# Patient Record
Sex: Male | Born: 1950 | Race: White | Hispanic: No | Marital: Married | Smoking: Never smoker
Health system: Southern US, Community
[De-identification: ages and names within clinical notes are randomized; demographics above are authoritative.]

## PROBLEM LIST (undated history)

## (undated) DIAGNOSIS — I441 Atrioventricular block, second degree: Secondary | ICD-10-CM

## (undated) DIAGNOSIS — R32 Unspecified urinary incontinence: Secondary | ICD-10-CM

## (undated) DIAGNOSIS — I10 Essential (primary) hypertension: Secondary | ICD-10-CM

## (undated) DIAGNOSIS — E1165 Type 2 diabetes mellitus with hyperglycemia: Secondary | ICD-10-CM

## (undated) DIAGNOSIS — E119 Type 2 diabetes mellitus without complications: Secondary | ICD-10-CM

## (undated) DIAGNOSIS — I499 Cardiac arrhythmia, unspecified: Secondary | ICD-10-CM

## (undated) DIAGNOSIS — K639 Disease of intestine, unspecified: Secondary | ICD-10-CM

## (undated) HISTORY — DX: Atrioventricular block, second degree: I44.1

## (undated) HISTORY — DX: Essential (primary) hypertension: I10

## (undated) HISTORY — PX: TUMOR REMOVAL: SHX12

## (undated) HISTORY — DX: Unspecified urinary incontinence: R32

## (undated) HISTORY — DX: Type 2 diabetes mellitus with hyperglycemia: E11.65

## (undated) HISTORY — DX: Disease of intestine, unspecified: K63.9

## (undated) HISTORY — PX: CATARACT EXTRACTION, BILATERAL: SHX1313

## (undated) HISTORY — DX: Cardiac arrhythmia, unspecified: I49.9

---

## 1999-01-17 ENCOUNTER — Emergency Department (HOSPITAL_COMMUNITY): Admission: EM | Admit: 1999-01-17 | Discharge: 1999-01-17 | Payer: Self-pay

## 2001-08-23 ENCOUNTER — Encounter: Payer: Self-pay | Admitting: Emergency Medicine

## 2001-08-23 ENCOUNTER — Emergency Department (HOSPITAL_COMMUNITY): Admission: EM | Admit: 2001-08-23 | Discharge: 2001-08-23 | Payer: Self-pay | Admitting: Emergency Medicine

## 2002-09-04 ENCOUNTER — Emergency Department (HOSPITAL_COMMUNITY): Admission: EM | Admit: 2002-09-04 | Discharge: 2002-09-04 | Payer: Self-pay | Admitting: Emergency Medicine

## 2002-09-04 ENCOUNTER — Encounter: Payer: Self-pay | Admitting: Emergency Medicine

## 2005-06-05 ENCOUNTER — Emergency Department (HOSPITAL_COMMUNITY): Admission: EM | Admit: 2005-06-05 | Discharge: 2005-06-05 | Payer: Self-pay | Admitting: Emergency Medicine

## 2008-03-18 ENCOUNTER — Emergency Department (HOSPITAL_COMMUNITY): Admission: EM | Admit: 2008-03-18 | Discharge: 2008-03-18 | Payer: Self-pay | Admitting: Emergency Medicine

## 2008-03-18 IMAGING — CR DG FOOT COMPLETE 3+V*R*
3 series · 3 of 3 positions shown · non-contrast
Comparison: None available.

CLINICAL DATA: Stepped on nail.  Severe pain.

RIGHT FOOT COMPLETE - 3+ VIEW

[t foot ap right]
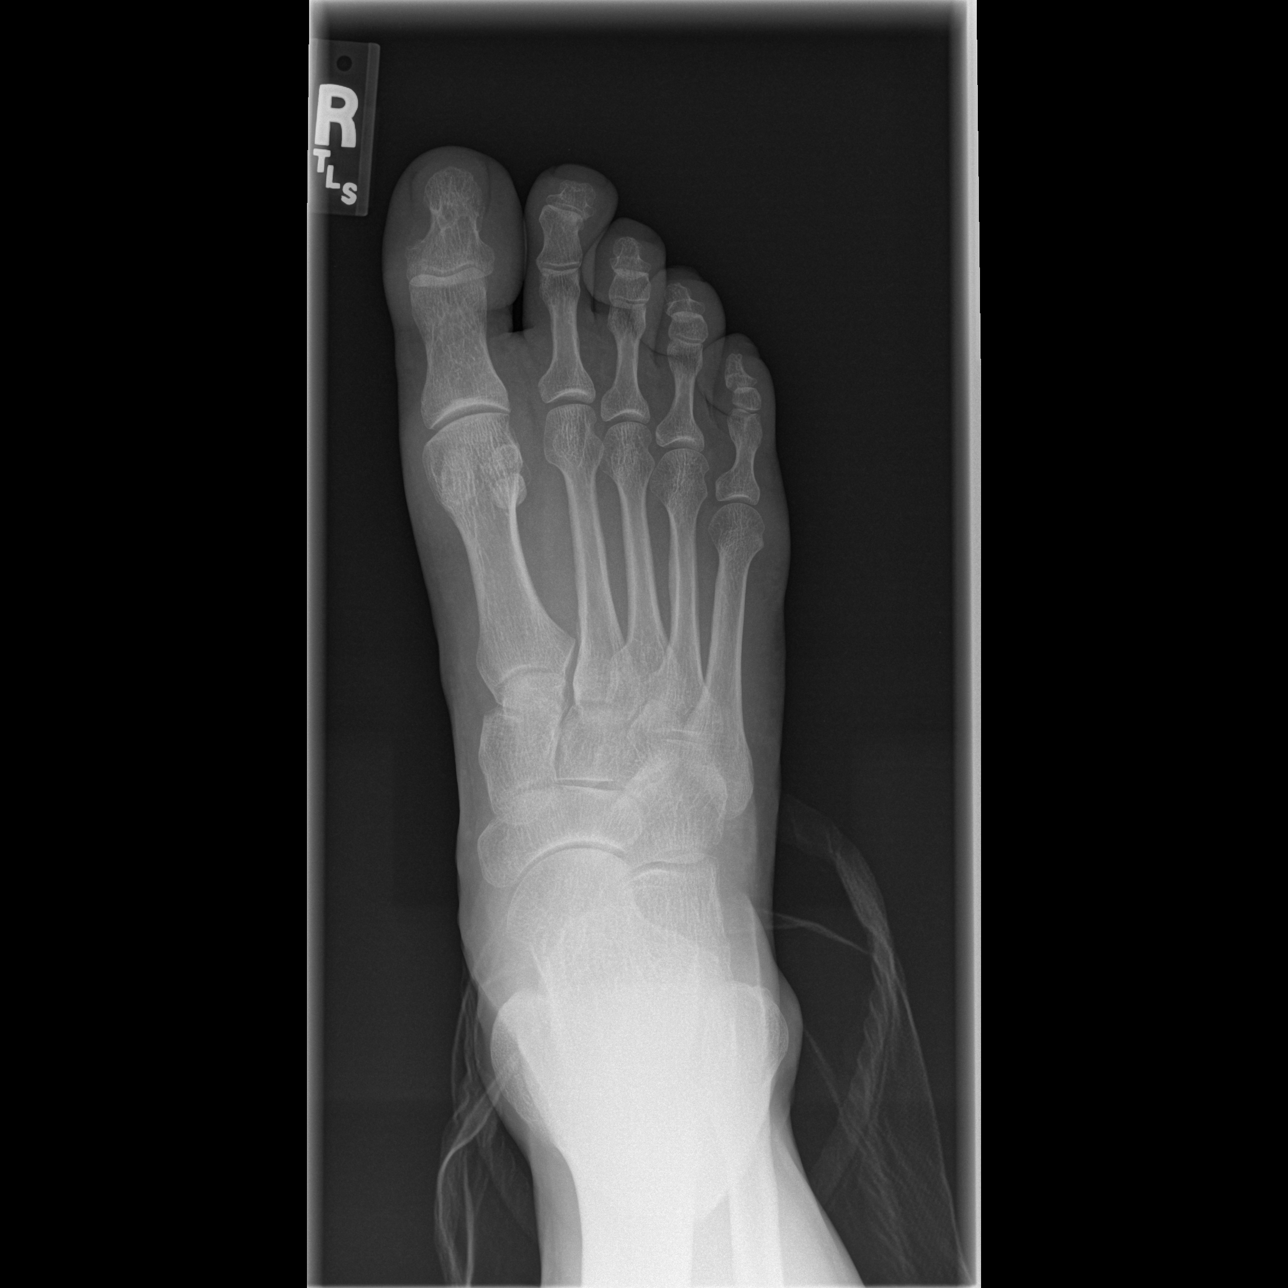

[t foot oblique right]
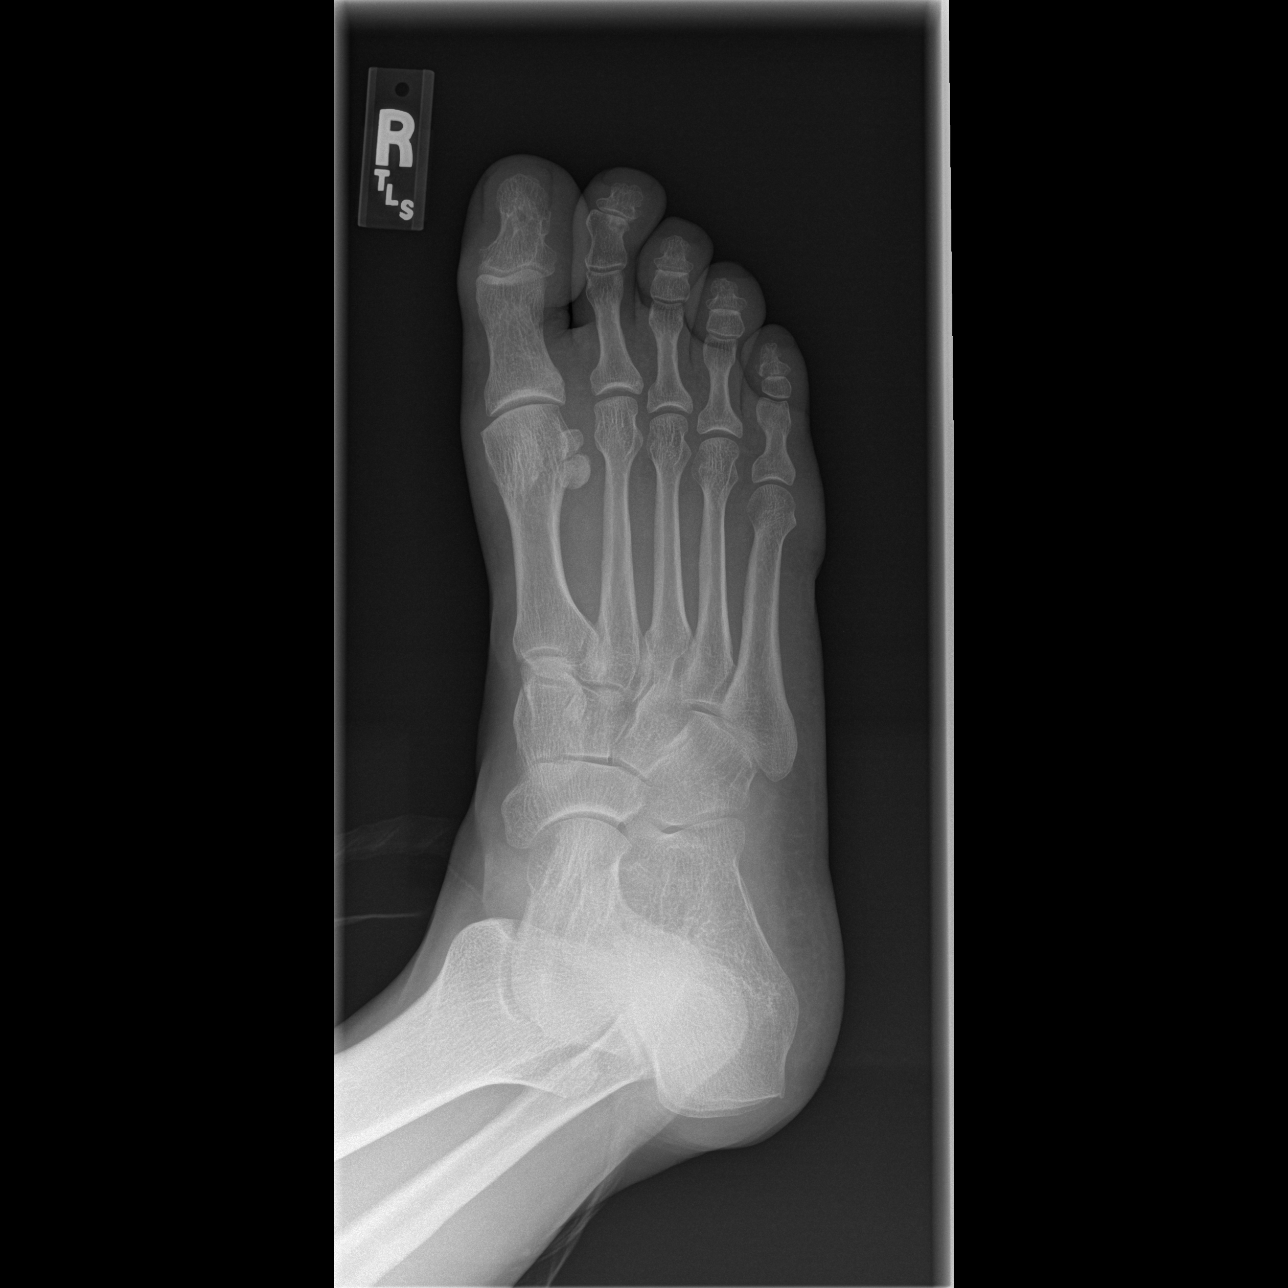

[t foot lat right]
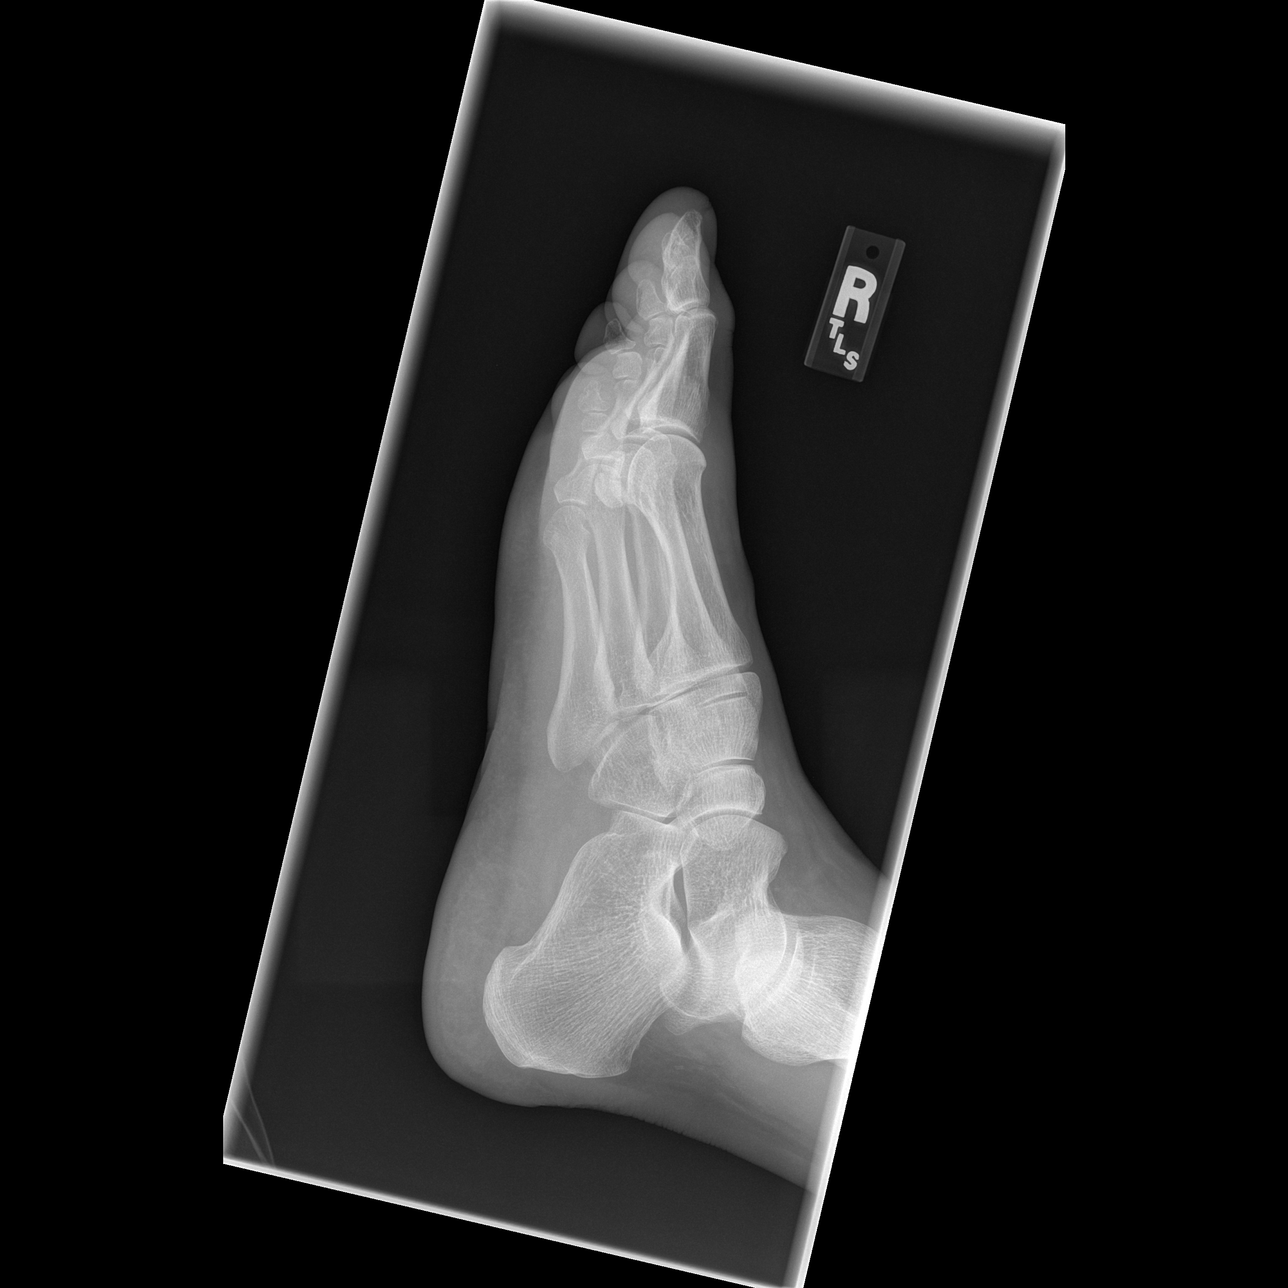

[3 of 3 positions shown; findings below may reference images not displayed]

FINDINGS: There is no evidence for acute fracture.  There is soft
tissue swelling along the plantar are surface the foot at the level
of the metatarsal heads.  There is some soft tissue swelling in the
great toe as well.  No radiopaque foreign body is evident.
IMPRESSION: Soft tissue swelling along the plantar surface of the foot and in
the great toe without evidence for fracture or radiopaque foreign
body.

## 2009-05-22 ENCOUNTER — Emergency Department (HOSPITAL_COMMUNITY): Admission: EM | Admit: 2009-05-22 | Discharge: 2009-05-22 | Payer: Self-pay | Admitting: Emergency Medicine

## 2009-05-22 IMAGING — CT CT ABD-PELV W/ CM
2 of 5 series · 17 of 46 positions shown, 19 images · IV contrast (APPLIED)
Comparison: None

CLINICAL DATA: Right lower quadrant abdominal pain.

CT ABDOMEN AND PELVIS WITH CONTRAST
TECHNIQUE: Multidetector CT imaging of the abdomen and pelvis was
performed following the standard protocol during bolus
administration of intravenous contrast.
Contrast: 125 ml [5Z]

[Series 2: abd_pel 5.0 b40f st · axial · 0.74mm/px · z∈[-436,-12]mm · 14 of 96 slices shown, 16 images]
[im 6/96  soft-tissue]
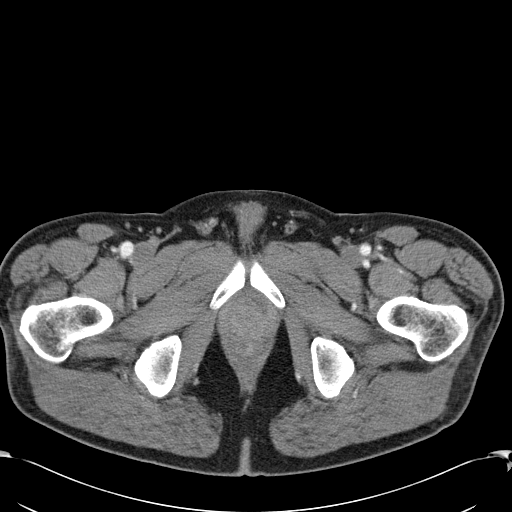
[im 6/96  bone]
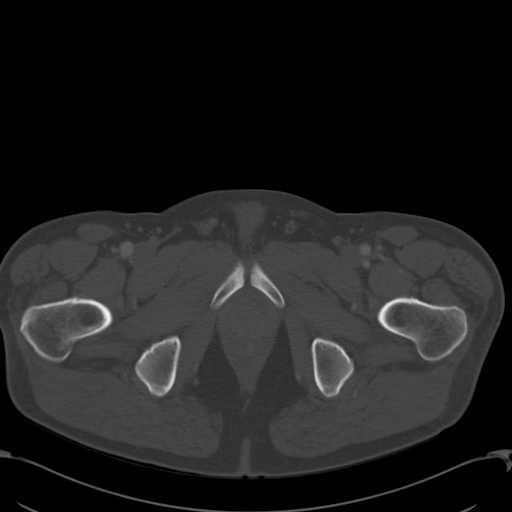
[im 11/96  soft-tissue]
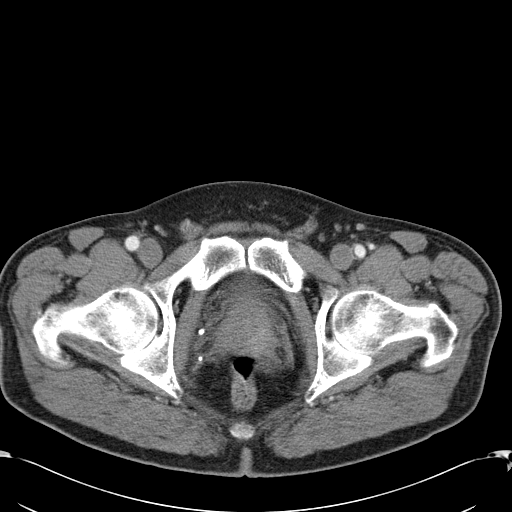
[im 21/96  soft-tissue]
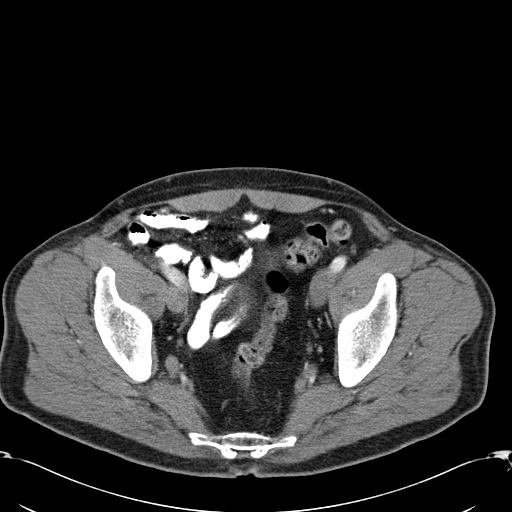
[im 26/96  soft-tissue]
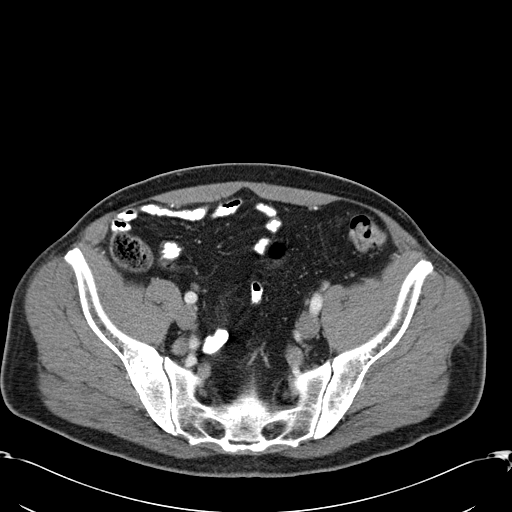
[im 31/96  soft-tissue]
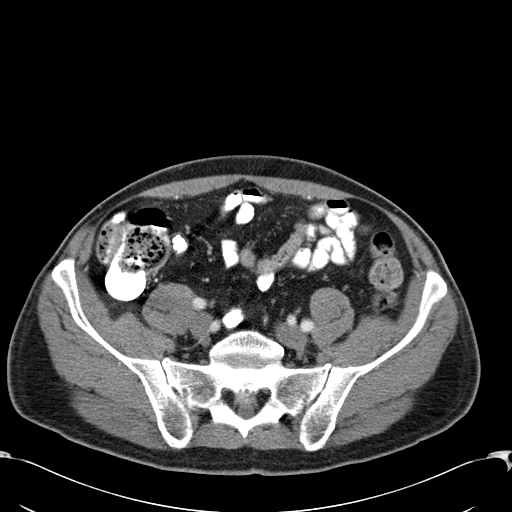
[im 41/96  soft-tissue]
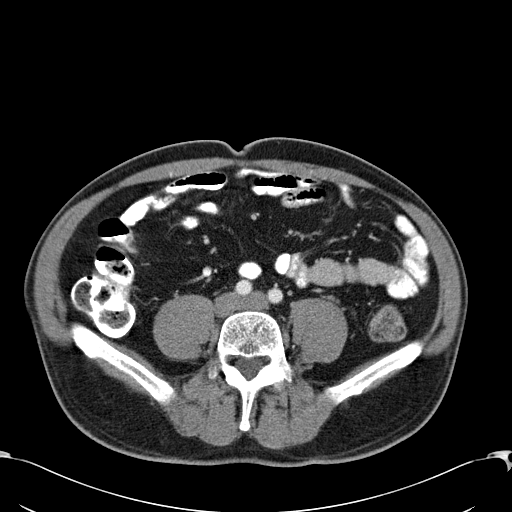
[im 46/96  soft-tissue]
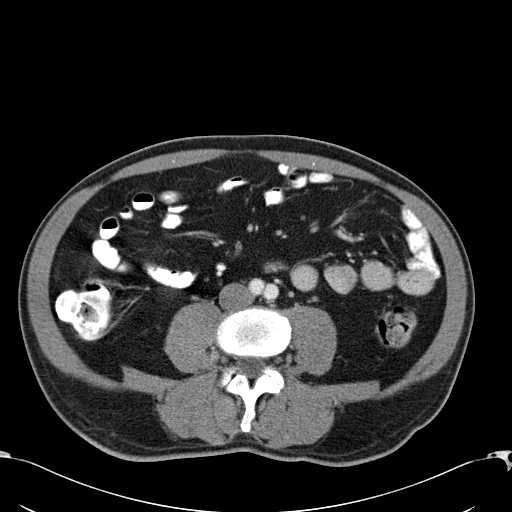
[im 51/96  soft-tissue]
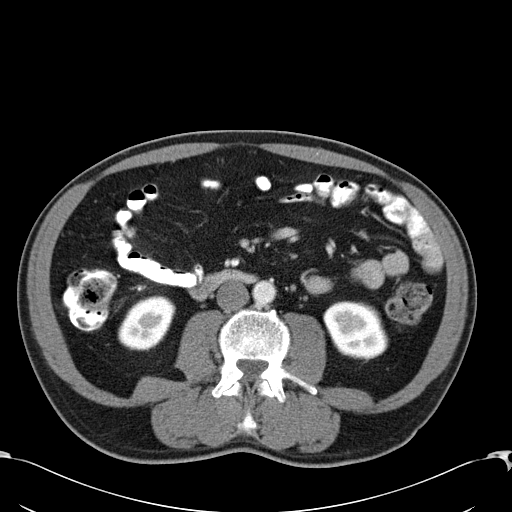
[im 56/96  soft-tissue]
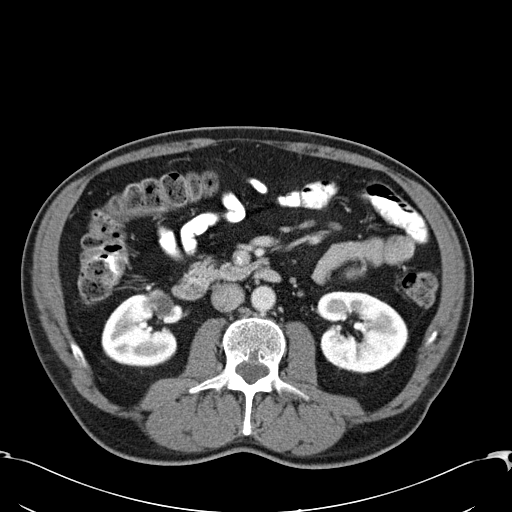
[im 56/96  bone]
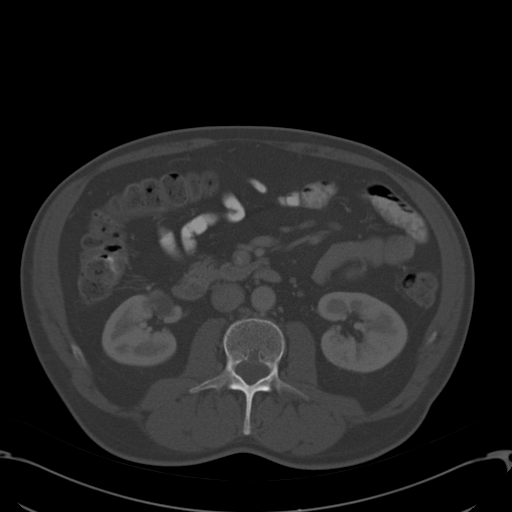
[im 66/96  soft-tissue]
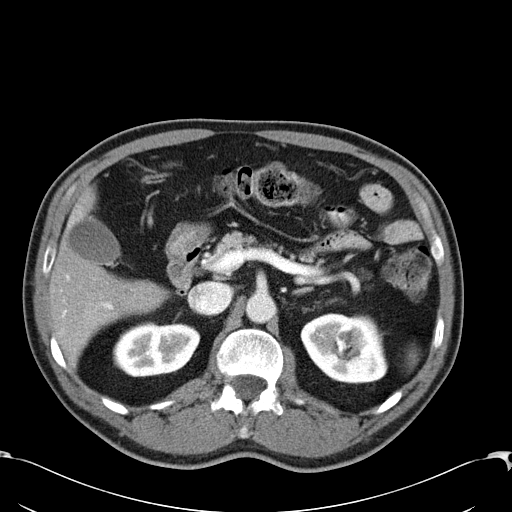
[im 71/96  soft-tissue]
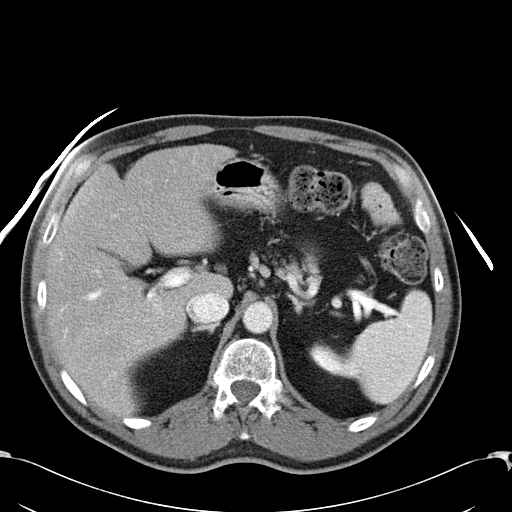
[im 76/96  soft-tissue]
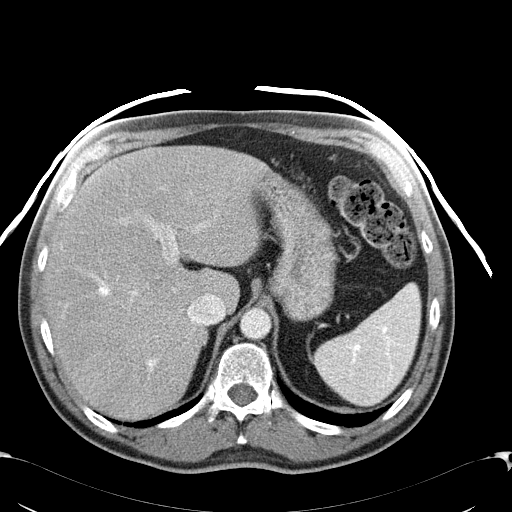
[im 86/96  soft-tissue]
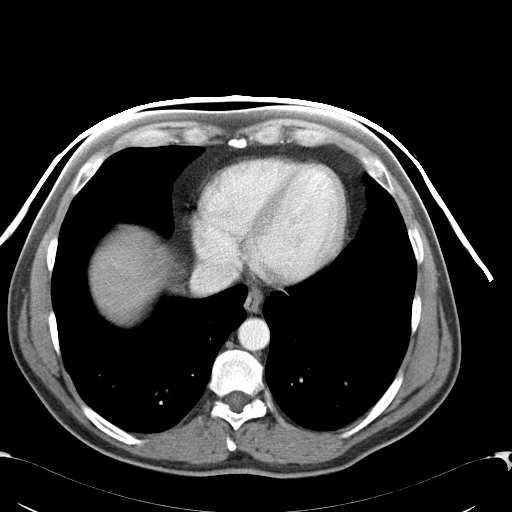
[im 91/96  soft-tissue]
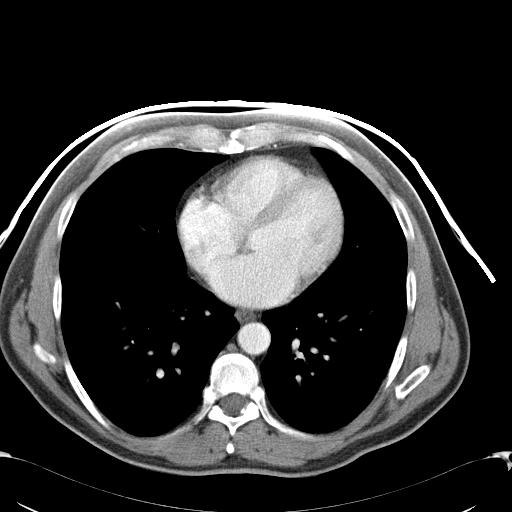

[Series 602: coronal · coronal · 0.97mm/px · 3 of 80 slices shown]
[im 27/80  soft-tissue]
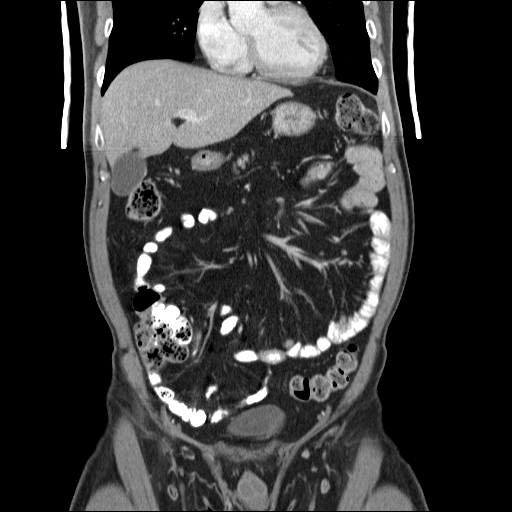
[im 36/80  soft-tissue]
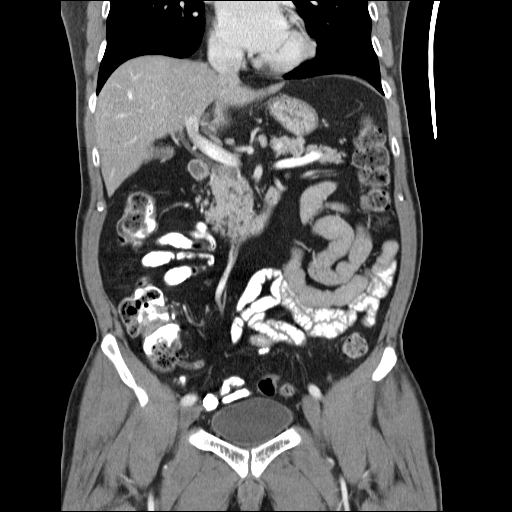
[im 44/80  soft-tissue]
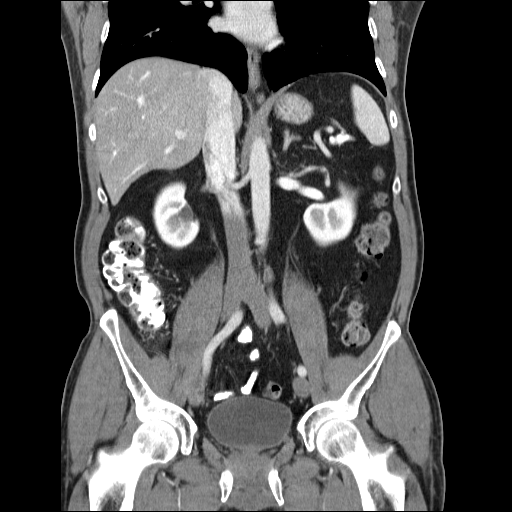

[17 of 46 positions shown; findings below may reference images not displayed]

FINDINGS: The lung bases are clear except for minimal dependent
atelectasis.  A calcified granuloma is noted at the left lung base.

There is mild diffuse fatty infiltration of the liver.  No focal
hepatic lesions or intrahepatic ductal dilatation.  Gallbladder is
normal.  The spleen is normal in size.  The pancreas is
unremarkable.  The adrenal glands and kidneys are normal except for
small bilateral renal cysts.

The stomach, duodenum, small bowel and colon unremarkable.  The
appendix is normal.

The bladder, prostate gland and seminal vesicles are unremarkable.
The rectosigmoid colon are normal. No inguinal mass or hernia.

The bony structures are unremarkable.
IMPRESSION: No acute abdominal/pelvic findings, mass lesions or adenopathy.

## 2010-06-04 LAB — COMPREHENSIVE METABOLIC PANEL
ALT: 24 U/L (ref 0–53)
AST: 17 U/L (ref 0–37)
Albumin: 4 g/dL (ref 3.5–5.2)
Alkaline Phosphatase: 76 U/L (ref 39–117)
Chloride: 104 mEq/L (ref 96–112)
GFR calc Af Amer: >60 mL/min (ref >60)
Potassium: 4 mEq/L (ref 3.5–5.1)
Sodium: 137 mEq/L (ref 135–145)
Total Bilirubin: 2 mg/dL — ABNORMAL HIGH (ref 0.3–1.2)

## 2010-06-04 LAB — DIFFERENTIAL
Basophils Absolute: 0 10*3/uL (ref 0.0–0.1)
Eosinophils Relative: 2 % (ref 0–5)
Lymphocytes Relative: 25 % (ref 12–46)
Monocytes Absolute: 0.7 10*3/uL (ref 0.1–1.0)

## 2010-06-04 LAB — URINALYSIS, ROUTINE W REFLEX MICROSCOPIC
Bilirubin Urine: NEGATIVE
Glucose, UA: >1000 mg/dL — AB
Hgb urine dipstick: NEGATIVE
Ketones, ur: NEGATIVE mg/dL
Leukocytes, UA: NEGATIVE
Nitrite: NEGATIVE
Protein, ur: NEGATIVE mg/dL
Specific Gravity, Urine: 1.02 (ref 1.005–1.030)
Urobilinogen, UA: 0.2 mg/dL (ref 0.0–1.0)
pH: 5 (ref 5.0–8.0)

## 2010-06-04 LAB — URINE MICROSCOPIC-ADD ON

## 2010-06-04 LAB — CBC
Platelets: 197 10*3/uL (ref 150–400)
WBC: 7.2 10*3/uL (ref 4.0–10.5)

## 2012-06-17 ENCOUNTER — Encounter (HOSPITAL_COMMUNITY): Payer: Self-pay | Admitting: Emergency Medicine

## 2012-06-17 ENCOUNTER — Emergency Department (HOSPITAL_COMMUNITY)
Admission: EM | Admit: 2012-06-17 | Discharge: 2012-06-17 | Disposition: A | Discharge status: Home / Self Care | Attending: Emergency Medicine | Admitting: Emergency Medicine

## 2012-06-17 DIAGNOSIS — IMO0002 Reserved for concepts with insufficient information to code with codable children: Secondary | ICD-10-CM | POA: Insufficient documentation

## 2012-06-17 DIAGNOSIS — M5416 Radiculopathy, lumbar region: Secondary | ICD-10-CM

## 2012-06-17 DIAGNOSIS — R52 Pain, unspecified: Secondary | ICD-10-CM | POA: Insufficient documentation

## 2012-06-17 MED ORDER — OXYCODONE-ACETAMINOPHEN 5-325 MG PO TABS: 2.0000 | ORAL_TABLET | ORAL | Status: DC | PRN

## 2012-06-17 MED ORDER — OXYCODONE-ACETAMINOPHEN 5-325 MG PO TABS
1.0000 | ORAL_TABLET | Freq: Once | ORAL | Status: AC
  Administered 2012-06-17: 1 via ORAL
  Filled 2012-06-17: qty 1

## 2012-06-17 MED ORDER — PREDNISONE 20 MG PO TABS: ORAL_TABLET | ORAL | Status: DC

## 2012-06-17 MED ORDER — FENTANYL CITRATE 0.05 MG/ML IJ SOLN
100.0000 ug | Freq: Once | INTRAMUSCULAR | Status: AC
  Administered 2012-06-17: 100 ug via NASAL
  Filled 2012-06-17: qty 2

## 2012-06-17 MED ORDER — PREDNISONE 20 MG PO TABS
60.0000 mg | ORAL_TABLET | Freq: Once | ORAL | Status: AC
  Administered 2012-06-17: 60 mg via ORAL
  Filled 2012-06-17: qty 3

## 2012-06-17 NOTE — ED Notes (Signed)
Right leg pain on outer side from thigh to knee, no known injury

## 2012-06-17 NOTE — ED Provider Notes (Signed)
History     CSN: [id number]  Arrival date & time 06/17/12  0135   First MD Initiated Contact with Patient 06/17/12 0448      Chief Complaint  Patient presents with  . Leg Pain    (Consider location/radiation/quality/duration/timing/severity/associated sxs/prior treatment) HPI This 62 year old male has a several day history of a constant 24-hour a day gradual onset severe right leg pain radiating from his right posterior low back and hip area down his right thigh below his knee to his lower leg worse with certain position changes but better if he actually exerts himself for a while without associated weakness or numbness swelling or color change to his leg without change in bowel or bladder function without fever without IV drug abuse he also is no abdominal pain chest pain or shortness of breath, he has had no improvement with over-the-counter anti-inflammatory and did have some improvement in the emergency department with one Percocet tablet, he has some chronic baseline low back pain but typically does not have radicular type pain, he is no color change or swelling to his leg and no trauma or recent immobilization. History reviewed. No pertinent past medical history.  Past Surgical History  Procedure Laterality Date  . 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 No pertinent family history.  History  Substance Use Topics  . Smoking status: Never Smoker   . Smokeless tobacco: Never Used  . Alcohol Use: Yes      Review of Systems 10 Systems reviewed and are negative for acute change except as noted in the HPI. Allergies  Vicodin  Home Medications   Current Outpatient Rx  Name  Route  Sig  Dispense  Refill  . naproxen (NAPROSYN) 500 MG tablet   Oral   Take 500 mg by mouth 2 (two) times daily with a meal.         . oxyCODONE-acetaminophen (PERCOCET) 5-325 MG per tablet   Oral   Take 2 tablets by mouth every 4 (four) hours as needed  for pain.   20 tablet   0   . predniSONE (DELTASONE) 20 MG tablet      2 tabs po daily x 4 days   8 tablet   0     BP 136/77  Pulse 67  Temp(Src) 97.7 F (36.5 C) (Oral)  Resp 18  SpO2 100%  Physical Exam  Nursing note and vitals reviewed. Constitutional:  Awake, alert, nontoxic appearance with baseline speech.  HENT:  Head: Atraumatic.  Eyes: Pupils are equal, round, and reactive to light. Right eye exhibits no discharge. Left eye exhibits no discharge.  Neck: Neck supple.  Cardiovascular: Normal rate and regular rhythm.   No murmur heard. Pulmonary/Chest: Effort normal and breath sounds normal. No respiratory distress. He has no wheezes. He has no rales. He exhibits no tenderness.  Abdominal: Soft. Bowel sounds are normal. He exhibits no mass. There is no tenderness. There is no rebound.  Musculoskeletal: He exhibits no edema and no tenderness.       Thoracic back: He exhibits no tenderness.       Lumbar back: He exhibits no tenderness.  Bilateral lower extremities non tender without new rashes or color change, baseline ROM with intact DP pulses, CR<2 secs all digits bilaterally, sensation baseline light touch bilaterally for pt, DTR's symmetric and intact bilaterally KJ / AJ, motor symmetric bilateral 5 / 5 hip flexion, quadriceps, hamstrings, EHL, foot dorsiflexion, foot  plantarflexion, gait somewhat antalgic but without apparent new ataxia. Back nontender.  Neurological: He is alert.  Mental status baseline for patient.  Upper extremity motor strength and sensation intact and symmetric bilaterally.  Skin: No rash noted.  Psychiatric: He has a normal mood and affect.    ED Course  Procedures (including critical care time)  Labs Reviewed - No data to display No results found.   1. Lumbar radiculopathy, acute       MDM  I doubt any other EMC precluding discharge at this time including, but not necessarily limited to the following:SBI, AAA, cauda equina  syndrome.        Hurman Horn, MD 06/23/12 [phone]

## 2012-11-17 ENCOUNTER — Other Ambulatory Visit (HOSPITAL_COMMUNITY): Payer: Self-pay | Admitting: Family Medicine

## 2012-11-17 DIAGNOSIS — R109 Unspecified abdominal pain: Secondary | ICD-10-CM

## 2012-11-17 DIAGNOSIS — R140 Abdominal distension (gaseous): Secondary | ICD-10-CM

## 2012-11-24 ENCOUNTER — Encounter (HOSPITAL_COMMUNITY)
Admission: RE | Admit: 2012-11-24 | Discharge: 2012-11-24 | Disposition: A | Discharge status: Home / Self Care | Source: Ambulatory Visit | Attending: Family Medicine | Admitting: Family Medicine

## 2012-11-24 DIAGNOSIS — R141 Gas pain: Secondary | ICD-10-CM | POA: Insufficient documentation

## 2012-11-24 DIAGNOSIS — R109 Unspecified abdominal pain: Secondary | ICD-10-CM

## 2012-11-24 DIAGNOSIS — R140 Abdominal distension (gaseous): Secondary | ICD-10-CM

## 2012-11-24 DIAGNOSIS — R142 Eructation: Secondary | ICD-10-CM | POA: Insufficient documentation

## 2012-11-24 IMAGING — NM NM HEPATO W/GB/PHARM/[PERSON_NAME]
1 series · 12 of 12 positions shown · non-contrast
Comparison: none

CLINICAL DATA: Right-sided abdominal pain.

[Series 1: hepato · 4.46mm/px · 2 acquisitions, 12 frames shown]
[im 1/2]
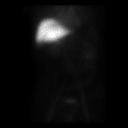
[im 1/2]
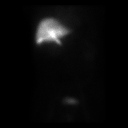
[im 1/2]
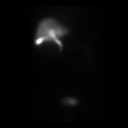
[im 1/2]
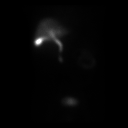
[im 1/2]
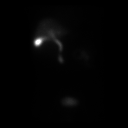
[im 1/2]
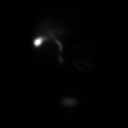
[im 2/2]
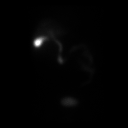
[im 2/2]
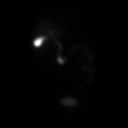
[im 2/2]
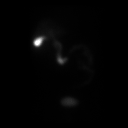
[im 2/2]
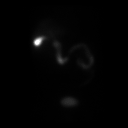
[im 2/2]
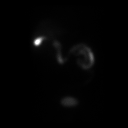
[im 2/2]
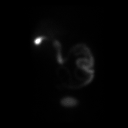

[12 of 12 positions shown; findings below may reference images not displayed]

NUCLEAR MEDICINE HEPATOBILIARY IMAGING WITH GALLBLADDER EF

Technique and findings:  Anterior imaging [HT] [HT]
Choletec IV. There is prompt clearance of the radiopharmaceutical
from the blood pool. Timely visualization of activity in central
bile ducts, small bowel, and gallbladder.
After 1 hour,1.4 mcg CCK was infused intravenously and imaging
continued. No symptoms with infusion. The calculated gallbladder
minutes (Ziessman et al., [HT] J. Nuclear Med).]

IMPRESSION
1. Patency of cystic and common bile ducts.
2. Normal gallbladder ejection fraction.

## 2012-11-24 MED ORDER — SINCALIDE 5 MCG IJ SOLR: 0.0200 ug/kg | Freq: Once | INTRAMUSCULAR | Status: DC

## 2012-11-24 MED ORDER — TECHNETIUM TC 99M MEBROFENIN IV KIT
5.4000 | PACK | Freq: Once | INTRAVENOUS | Status: AC | PRN
Start: 2012-11-24 — End: 2012-11-24
  Administered 2012-11-24: 5 via INTRAVENOUS

## 2019-03-10 ENCOUNTER — Emergency Department (HOSPITAL_COMMUNITY)
Admission: EM | Admit: 2019-03-10 | Discharge: 2019-03-11 | Disposition: A | Discharge status: Home / Self Care | Payer: Medicare Other | Attending: Emergency Medicine | Admitting: Emergency Medicine

## 2019-03-10 ENCOUNTER — Encounter (HOSPITAL_COMMUNITY): Payer: Self-pay

## 2019-03-10 ENCOUNTER — Emergency Department (HOSPITAL_COMMUNITY): Payer: Medicare Other

## 2019-03-10 ENCOUNTER — Other Ambulatory Visit: Payer: Self-pay

## 2019-03-10 DIAGNOSIS — R319 Hematuria, unspecified: Secondary | ICD-10-CM | POA: Insufficient documentation

## 2019-03-10 DIAGNOSIS — E119 Type 2 diabetes mellitus without complications: Secondary | ICD-10-CM | POA: Diagnosis not present

## 2019-03-10 HISTORY — DX: Type 2 diabetes mellitus without complications: E11.9

## 2019-03-10 LAB — BASIC METABOLIC PANEL
Anion gap: 14 (ref 5–15)
BUN: 14 mg/dL (ref 8–23)
CO2: 21 mmol/L — ABNORMAL LOW (ref 22–32)
Calcium: 9.4 mg/dL (ref 8.9–10.3)
Chloride: 99 mmol/L (ref 98–111)
Creatinine, Ser: 0.84 mg/dL (ref 0.61–1.24)
GFR calc Af Amer: >60 mL/min (ref >60)
GFR calc non Af Amer: >60 mL/min (ref >60)
Glucose, Bld: 233 mg/dL — ABNORMAL HIGH (ref 70–99)
Potassium: 3.9 mmol/L (ref 3.5–5.1)
Sodium: 134 mmol/L — ABNORMAL LOW (ref 135–145)

## 2019-03-10 LAB — CBC
HCT: 49.5 % (ref 39.0–52.0)
Hemoglobin: 16.8 g/dL (ref 13.0–17.0)
MCH: 29.4 pg (ref 26.0–34.0)
MCHC: 33.9 g/dL (ref 30.0–36.0)
MCV: 86.7 fL (ref 80.0–100.0)
Platelets: 220 10*3/uL (ref 150–400)
RBC: 5.71 MIL/uL (ref 4.22–5.81)
RDW: 12.6 % (ref 11.5–15.5)
WBC: 7.7 10*3/uL (ref 4.0–10.5)
nRBC: 0 % (ref 0.0–0.2)

## 2019-03-10 IMAGING — CT CT RENAL STONE PROTOCOL
2 of 4 series · 16 of 46 positions shown, 18 images · non-contrast
Comparison: CT [DATE]

CLINICAL DATA: Left flank pain with hematuria

EXAM:
CT ABDOMEN AND PELVIS WITHOUT CONTRAST
TECHNIQUE: Multidetector CT imaging of the abdomen and pelvis was performed
following the standard protocol without IV contrast.

[Series 2: axial st · axial · 0.68mm/px · z∈[+1114,+1529]mm · 13 of 93 slices shown, 15 images]
[im 5/93  soft-tissue]
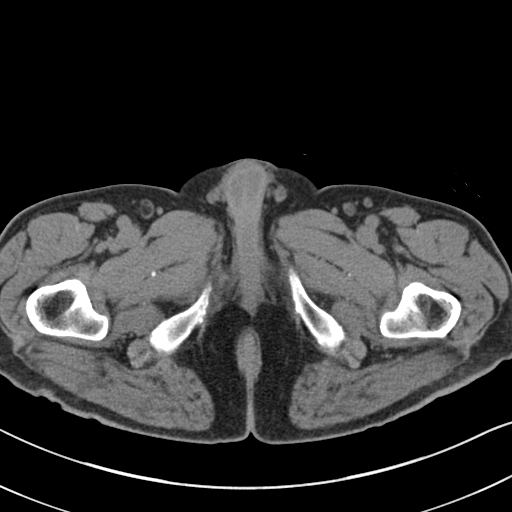
[im 5/93  bone]
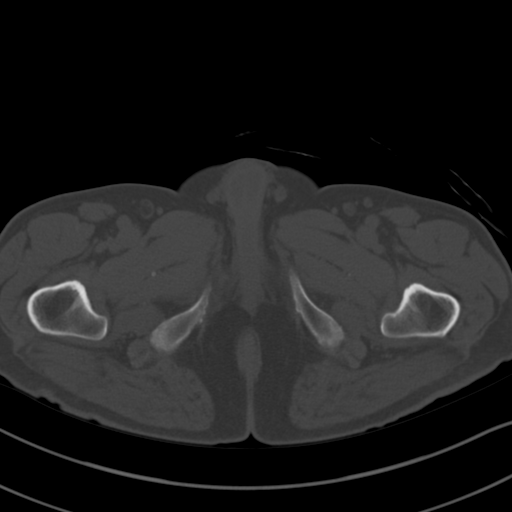
[im 15/93  soft-tissue]
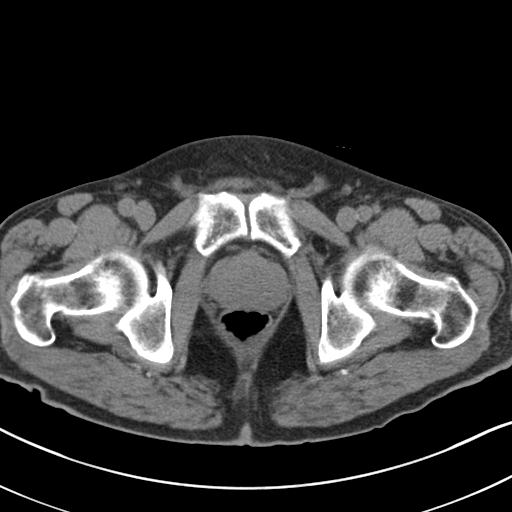
[im 20/93  soft-tissue]
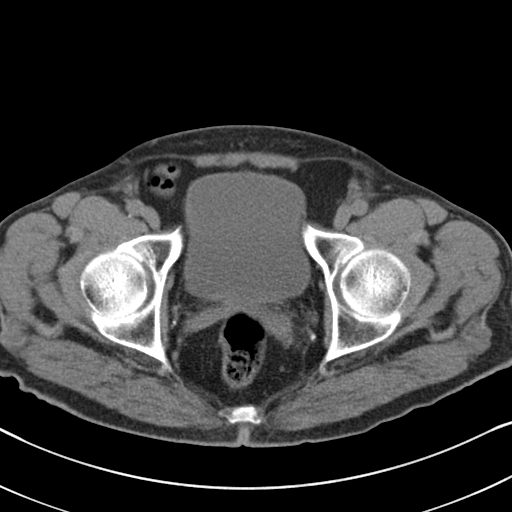
[im 25/93  soft-tissue]
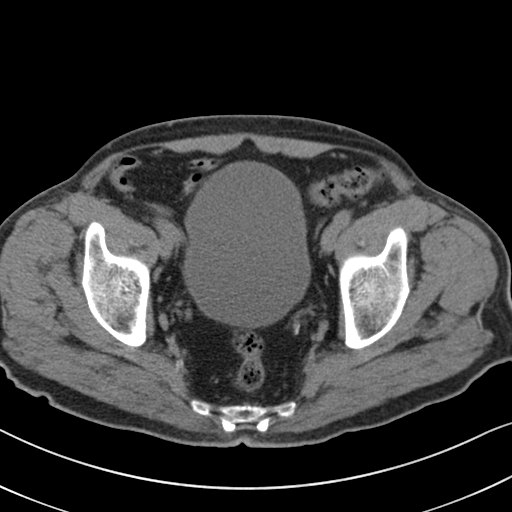
[im 34/93  soft-tissue]
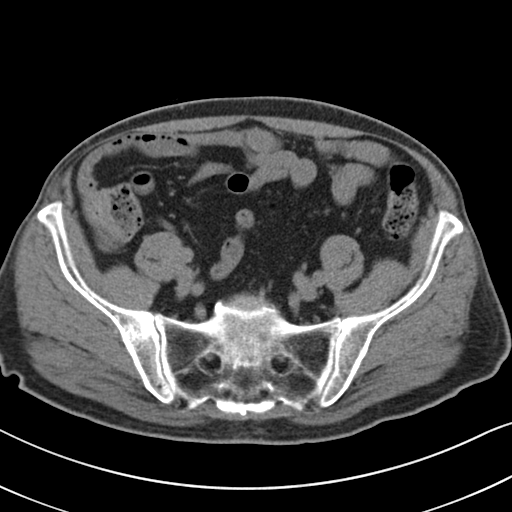
[im 39/93  soft-tissue]
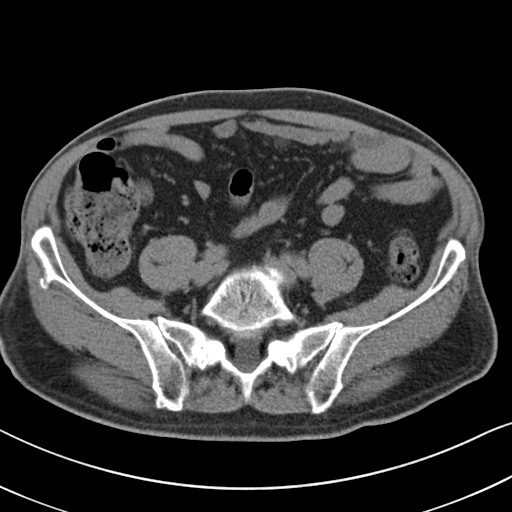
[im 49/93  soft-tissue]
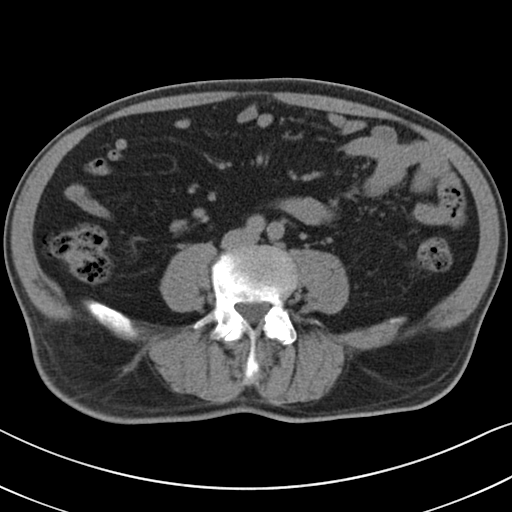
[im 54/93  soft-tissue]
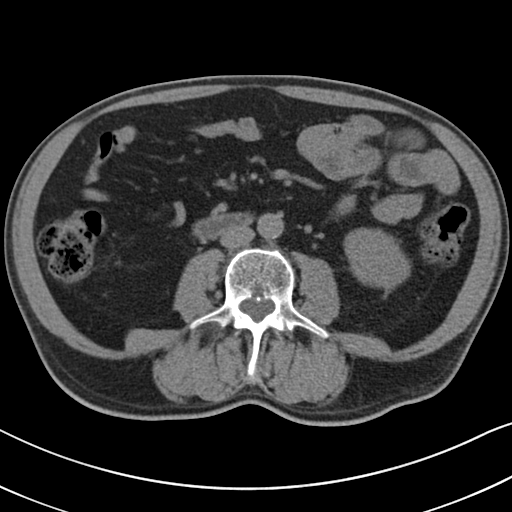
[im 59/93  soft-tissue]
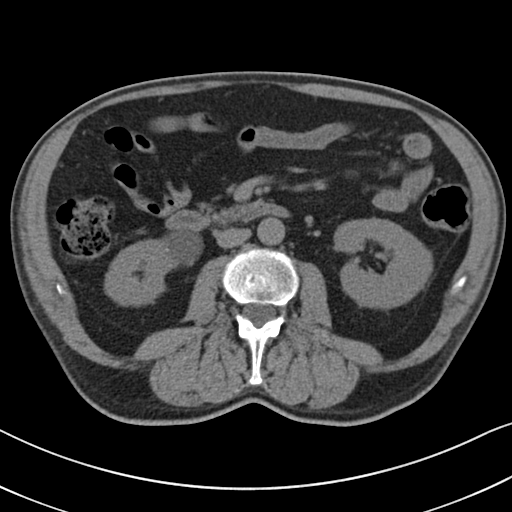
[im 59/93  bone]
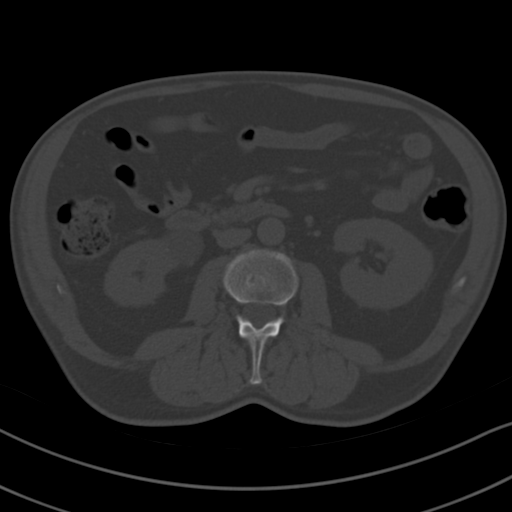
[im 68/93  soft-tissue]
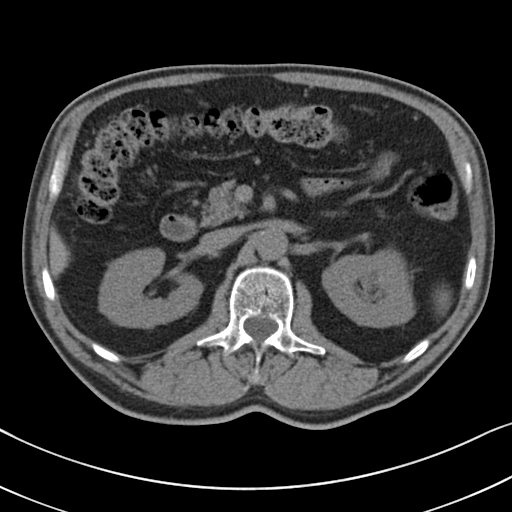
[im 73/93  soft-tissue]
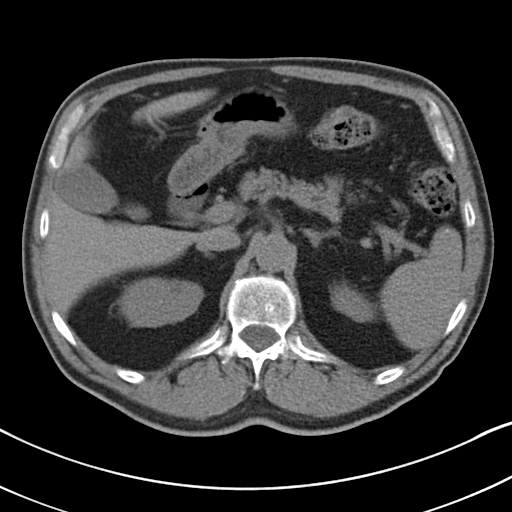
[im 78/93  soft-tissue]
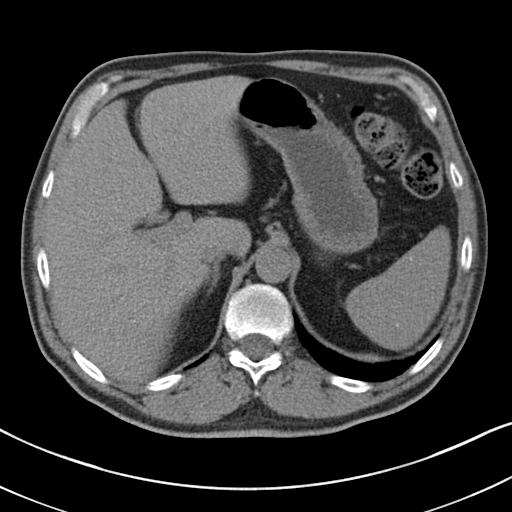
[im 88/93  soft-tissue]
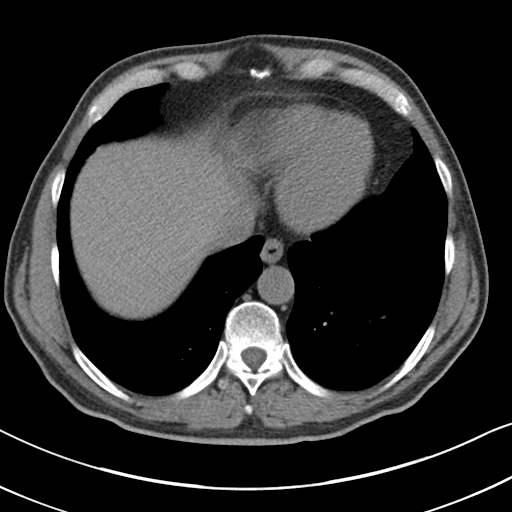

[Series 5: coronal · coronal · 0.65mm/px · 3 of 126 slices shown]
[im 42/126  soft-tissue]
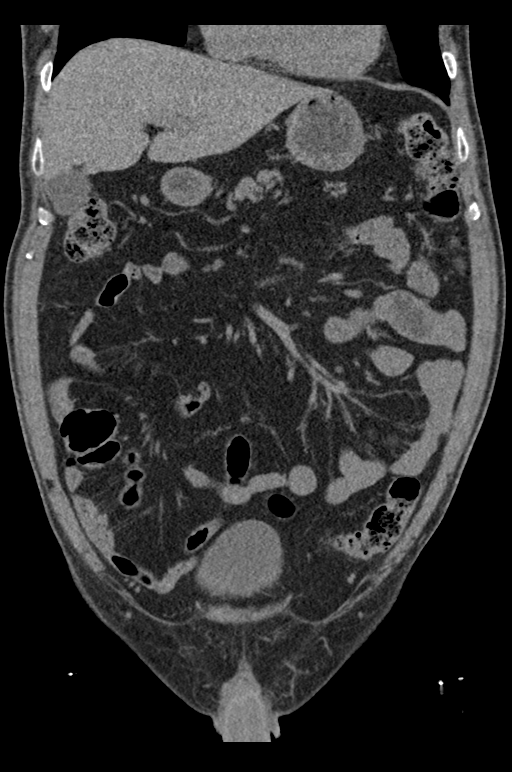
[im 56/126  soft-tissue]
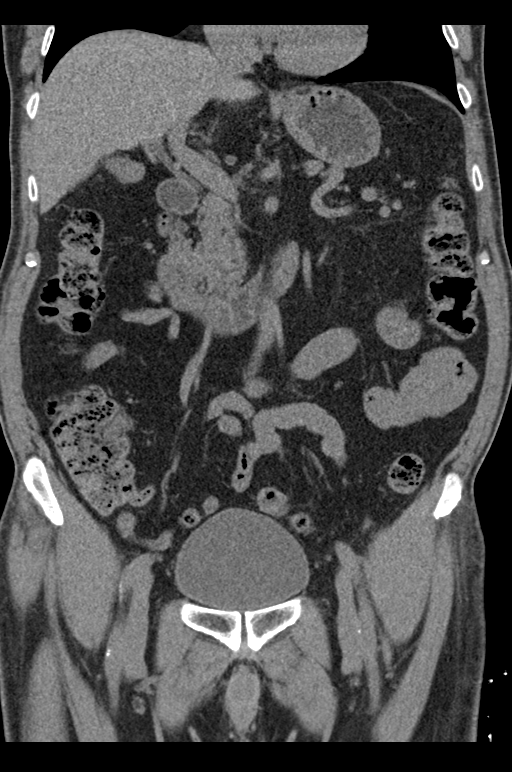
[im 70/126  soft-tissue]
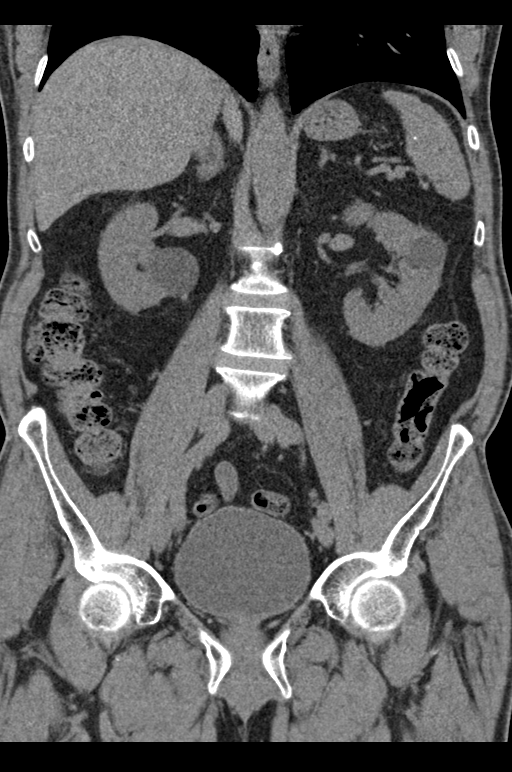

[16 of 46 positions shown; findings below may reference images not displayed]

FINDINGS: Lower chest: Lung bases demonstrate no acute consolidation or
pleural effusion. Heart size is normal. Small hiatal hernia

Hepatobiliary: No focal liver abnormality is seen. No gallstones,
gallbladder wall thickening, or biliary dilatation.

Pancreas: Unremarkable. No pancreatic ductal dilatation or
surrounding inflammatory changes.

Spleen: Normal in size without focal abnormality.

Adrenals/Urinary Tract: Adrenal glands are normal. No
hydronephrosis. Bilateral low-attenuation renal lesions likely
cysts. Subcentimeter hypodensity lower pole left kidney too small to
further characterize. Negative for ureteral stone. Bladder is normal

Stomach/Bowel: Stomach is within normal limits. Appendix appears
normal. No evidence of bowel wall thickening, distention, or
inflammatory changes.

Vascular/Lymphatic: Mild aortic atherosclerosis. No aneurysm. No
significant adenopathy

Reproductive: Prostate is unremarkable.

Other: Negative for free air or free fluid

Musculoskeletal: No acute or significant osseous findings.
IMPRESSION: 1. Negative for hydronephrosis or ureteral stone.
2. No CT evidence for acute intra-abdominal or pelvic abnormality

## 2019-03-10 NOTE — ED Notes (Signed)
Pt aware that urine sample is needed.  

## 2019-03-10 NOTE — ED Notes (Signed)
Patient transported to CT 

## 2019-03-10 NOTE — ED Notes (Signed)
Patient drinking water with no difficulty at this time

## 2019-03-10 NOTE — ED Provider Notes (Signed)
[facility]-EMERGENCY DEPT [facility] Emergency Department Provider Note MRN:  [id number]  Arrival date & time: 03/11/19     Chief Complaint   Hematuria   History of Present Illness   Mark English is a 68 y.o. year-old male with a history of diabetes presenting to the ED with chief complaint of hematuria.  Shortly prior to arrival, patient was urinating and noticed blood in his urine.  Came immediately to the emergency department for evaluation.  Has been experiencing some left flank pain for the past 2 days, constant, mild to moderate in severity.  Denies fever, no chest pain or shortness of breath, no abdominal pain, denies anticoagulation.  Review of Systems  A complete 10 system review of systems was obtained and all systems are negative except as noted in the HPI and PMH.   Patient's Health History    Past Medical History:  Diagnosis Date  . Diabetes mellitus without complication [facility])     Past Surgical History:  Procedure Laterality Date  . 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  Social History   Socioeconomic History  . Marital status: Married    Spouse name: Not on file  . Number of children: Not on file  . Years of education: Not on file  . Highest education level: Not on file  Occupational History  . Not on file  Tobacco Use  . Smoking status: Never Smoker  . Smokeless tobacco: Never Used  Substance and Sexual Activity  . Alcohol use: Yes  . Drug use: No  . Sexual activity: Not on file  Other Topics Concern  . Not on file  Social History Narrative  . Not on file   Social Determinants of Health   Financial Resource Strain:   . Difficulty of Paying Living Expenses: Not on file  Food Insecurity:   . Worried About Programme researcher, broadcasting/film/video in the Last Year: Not on file  . Ran Out of Food in the Last Year: Not on file  Transportation Needs:   . Lack of Transportation (Medical): Not on file  . Lack of  Transportation (Non-Medical): Not on file  Physical Activity:   . Days of Exercise per Week: Not on file  . Minutes of Exercise per Session: Not on file  Stress:   . Feeling of Stress : Not on file  Social Connections:   . Frequency of Communication with Friends and Family: Not on file  . Frequency of Social Gatherings with Friends and Family: Not on file  . Attends Religious Services: Not on file  . Active Member of Clubs or Organizations: Not on file  . Attends Banker Meetings: Not on file  . Marital Status: Not on file  Intimate Partner Violence:   . Fear of Current or Ex-Partner: Not on file  . Emotionally Abused: Not on file  . Physically Abused: Not on file  . Sexually Abused: Not on file     Physical Exam  Vital Signs and Nursing Notes reviewed Vitals:   03/10/19 2046 03/11/19 0000  BP: (!) 181/90 (!) 189/87  Pulse: 70 (!) 52  Resp: 18 16  Temp: 98 F (36.7 C)   SpO2: 100% 98%    CONSTITUTIONAL: Well-appearing, NAD NEURO:  Alert and oriented x 3, no focal deficits EYES:  eyes equal and reactive ENT/NECK:  no LAD, no JVD CARDIO: Regular rate, well-perfused, normal S1 and S2 PULM:  CTAB no wheezing or rhonchi GI/GU:  normal bowel sounds, non-distended, non-tender MSK/SPINE:  No gross deformities, no edema SKIN:  no rash, atraumatic PSYCH:  Appropriate speech and behavior  Diagnostic and Interventional Summary    EKG Interpretation  Date/Time:    Ventricular Rate:    PR Interval:    QRS Duration:   QT Interval:    QTC Calculation:   R Axis:     Text Interpretation:        Labs Reviewed  URINALYSIS, ROUTINE W REFLEX MICROSCOPIC - Abnormal; Notable for the following components:      Result Value   Glucose, UA >=500 (*)    Hgb urine dipstick LARGE (*)    Ketones, ur 20 (*)    Protein, ur 30 (*)    RBC / HPF >50 (*)    All other components within normal limits  BASIC METABOLIC PANEL - Abnormal; Notable for the following components:    Sodium 134 (*)    CO2 21 (*)    Glucose, Bld 233 (*)    All other components within normal limits  URINE CULTURE  CBC    CT Renal Stone  Final Result      Medications - No data to display   Procedures  /  Critical Care Procedures  ED Course and Medical Decision Making  I have reviewed the triage vital signs and the nursing notes.  Pertinent labs & imaging results that were available during my care of the patient were reviewed by me and considered in my medical decision making (see below for details).     Question of kidney stone versus UTI, no rash on exam to suggest shingles, labs reassuring, awaiting urinalysis and CT scan.  CT scan is unrevealing, no neoplasm, no kidney stone.  Urinalysis is with blood but no evidence of infection.  Patient continues to look and feel well with normal vital signs, appropriate for discharge with outpatient urology follow-up.  Barth Kirks. Sedonia Small, [place] [email or web address]  Final Clinical Impressions(s) / ED Diagnoses     ICD-10-CM   1. Hematuria, unspecified type  R31.9     ED Discharge Orders    None       Discharge Instructions Discussed with and Provided to Patient:     Discharge Instructions     You were evaluated in the Emergency Department and after careful evaluation, we did not find any emergent condition requiring admission or further testing in the hospital.  Your exam/testing today is overall reassuring.  Please follow-up with the urologist for further management of the blood in your urine.  Please return to the Emergency Department if you experience any worsening of your condition.  We encourage you to follow up with a primary care provider.  Thank you for allowing [place] to be a part of your care.       Maudie Flakes, MD 03/11/19 [phone]

## 2019-03-10 NOTE — ED Triage Notes (Signed)
Pt coming from home c/o hematuria that started 30 minutes ago. Left sided abdominal pain that radiates to back. Denies pain. No N/V/D.

## 2019-03-11 LAB — URINALYSIS, ROUTINE W REFLEX MICROSCOPIC
Bacteria, UA: NONE SEEN
Bilirubin Urine: NEGATIVE
Glucose, UA: >=500 mg/dL — AB
Ketones, ur: 20 mg/dL — AB
Leukocytes,Ua: NEGATIVE
Nitrite: NEGATIVE
Protein, ur: 30 mg/dL — AB
RBC / HPF: >50 RBC/hpf — ABNORMAL HIGH (ref 0–5)
Specific Gravity, Urine: 1.021 (ref 1.005–1.030)
pH: 5 (ref 5.0–8.0)

## 2019-03-11 LAB — URINE CULTURE: Culture: NO GROWTH

## 2019-03-11 NOTE — Discharge Instructions (Addendum)
You were evaluated in the Emergency Department and after careful evaluation, we did not find any emergent condition requiring admission or further testing in the hospital.  Your exam/testing today is overall reassuring.  Please follow-up with the urologist for further management of the blood in your urine.  Please return to the Emergency Department if you experience any worsening of your condition.  We encourage you to follow up with a primary care provider.  Thank you for allowing [place] to be a part of your care.

## 2019-07-13 ENCOUNTER — Telehealth: Payer: Self-pay

## 2019-07-13 NOTE — Telephone Encounter (Signed)
REFERRAL ONLY FILE FROM [facility] [phone], SENT REFERRAL TO SCHEDULING

## 2019-07-16 ENCOUNTER — Telehealth: Payer: Self-pay

## 2019-07-16 NOTE — Telephone Encounter (Signed)
  Patient Consent for Virtual Visit         Mark English has provided verbal consent on 07/16/2019 for a virtual visit (video or telephone).   CONSENT FOR VIRTUAL VISIT FOR:  Mark English  By participating in this virtual visit I agree to the following:  I hereby voluntarily request, consent and authorize [facility] and its employed or contracted physicians, physician assistants, nurse practitioners or other licensed health care professionals (the Practitioner), to provide me with telemedicine health care services (the "Services") as deemed necessary by the treating Practitioner. I acknowledge and consent to receive the Services by the Practitioner via telemedicine. I understand that the telemedicine visit will involve communicating with the Practitioner through live audiovisual communication technology and the disclosure of certain medical information by electronic transmission. I acknowledge that I have been given the opportunity to request an in-person assessment or other available alternative prior to the telemedicine visit and am voluntarily participating in the telemedicine visit.  I understand that I have the right to withhold or withdraw my consent to the use of telemedicine in the course of my care at any time, without affecting my right to future care or treatment, and that the Practitioner or I may terminate the telemedicine visit at any time. I understand that I have the right to inspect all information obtained and/or recorded in the course of the telemedicine visit and may receive copies of available information for a reasonable fee.  I understand that some of the potential risks of receiving the Services via telemedicine include:  Marland Kitchen Delay or interruption in medical evaluation due to technological equipment failure or disruption; . Information transmitted may not be sufficient (e.g. poor resolution of images) to allow for appropriate medical decision making by the Practitioner;  and/or  . In rare instances, security protocols could fail, causing a breach of personal health information.  Furthermore, I acknowledge that it is my responsibility to provide information about my medical history, conditions and care that is complete and accurate to the best of my ability. I acknowledge that Practitioner's advice, recommendations, and/or decision may be based on factors not within their control, such as incomplete or inaccurate data provided by me or distortions of diagnostic images or specimens that may result from electronic transmissions. I understand that the practice of medicine is not an exact science and that Practitioner makes no warranties or guarantees regarding treatment outcomes. I acknowledge that a copy of this consent can be made available to me via my patient portal [facility] MyChart), or I can request a printed copy by calling the office of [facility].    I understand that my insurance will be billed for this visit.   I have read or had this consent read to me. . I understand the contents of this consent, which adequately explains the benefits and risks of the Services being provided via telemedicine.  . I have been provided ample opportunity to ask questions regarding this consent and the Services and have had my questions answered to my satisfaction. . I give my informed consent for the services to be provided through the use of telemedicine in my medical care

## 2019-07-22 NOTE — Progress Notes (Signed)
This encounter was created in error - please disregard.

## 2019-07-23 ENCOUNTER — Encounter: Payer: Self-pay | Admitting: Cardiology

## 2019-07-23 ENCOUNTER — Other Ambulatory Visit: Payer: Self-pay

## 2019-07-23 ENCOUNTER — Encounter: Payer: Medicare Other | Admitting: Cardiology

## 2019-07-28 ENCOUNTER — Ambulatory Visit: Payer: Medicare Other | Admitting: Cardiology

## 2019-07-28 NOTE — Progress Notes (Deleted)
Cardiology Consult  Note    Date:  07/28/2019   ID:  Mark English, DOB 1951/02/10, MRN [id number]  PCP:  Patient, No Pcp Per  Cardiologist:  Armanda Magic, MD   No chief complaint on file.   History of Present Illness:  Mark English is a 69 y.o. male who is being seen today for the evaluation of *** at the request of Hal Hope Marcos Eke, MD.    Past Medical History:  Diagnosis Date  . Atrioventricular block, Mobitz type 2   . Diabetes mellitus without complication (HCC)   . Disease of intestine   . Hypertension   . Irregular heart beat   . Type 2 diabetes mellitus with hyperglycemia (HCC)   . Unspecified urinary incontinence     Past Surgical History:  Procedure Laterality Date  . 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    Allergies:   Vicodin [hydrocodone-acetaminophen]   Social History   Socioeconomic History  . Marital status: Married    Spouse name: Not on file  . Number of children: Not on file  . Years of education: Not on file  . Highest education level: Not on file  Occupational History  . Not on file  Tobacco Use  . Smoking status: Never Smoker  . Smokeless tobacco: Never Used  Substance and Sexual Activity  . Alcohol use: Yes  . Drug use: No  . Sexual activity: Not on file  Other Topics Concern  . Not on file  Social History Narrative  . Not on file   Social Determinants of Health   Financial Resource Strain:   . Difficulty of Paying Living Expenses:   Food Insecurity:   . Worried About Programme researcher, broadcasting/film/video in the Last Year:   . Barista in the Last Year:   Transportation Needs:   . Freight forwarder (Medical):   Marland Kitchen Lack of Transportation (Non-Medical):   Physical Activity:   . Days of Exercise per Week:   . Minutes of Exercise per Session:   Stress:   .  Feeling of Stress :   Social Connections:   . Frequency of Communication with Friends and Family:   . Frequency of Social Gatherings with Friends and Family:   . Attends Religious Services:   . Active Member of Clubs or Organizations:   . Attends Banker Meetings:   Marland Kitchen Marital Status:      Family History:  The patient's ***family history is not on file.   ROS:   Please see the history of present illness.    ROS All other systems reviewed and are negative.  No flowsheet data found.     PHYSICAL EXAM:   VS:  There were no vitals taken for this visit.   GEN: Well nourished, well developed, in no acute distress  HEENT: normal  Neck: no JVD, carotid bruits, or masses Cardiac: ***RRR; no murmurs, rubs, or gallops,no edema.  Intact distal pulses bilaterally.  Respiratory:  clear to auscultation bilaterally, normal work of breathing GI: soft, nontender, nondistended, + BS MS: no deformity or atrophy  Skin: warm and dry, no rash Neuro:  Alert and Oriented x 3, Strength and sensation are intact Psych: euthymic mood, full affect  Wt Readings from Last 3 Encounters:  03/10/19 170 lb (77.1 kg)  11/24/12 155 lb (70.3 kg)      Studies/Labs Reviewed:   EKG:  EKG is*** ordered today.  The ekg ordered today demonstrates ***  Recent Labs: 03/10/2019: BUN 14; Creatinine, Ser 0.84; Hemoglobin 16.8; Platelets 220; Potassium 3.9; Sodium 134   Lipid Panel No results found for: CHOL, TRIG, HDL, CHOLHDL, VLDL, LDLCALC, LDLDIRECT  Additional studies/ records that were reviewed today include:  ***    ASSESSMENT:    No diagnosis found.   PLAN:  In order of problems listed above:  1. ***    Medication Adjustments/Labs and Tests Ordered: Current medicines are reviewed at length with the patient today.  Concerns regarding medicines are outlined above.  Medication changes, Labs and Tests ordered today are listed in the Patient Instructions below.  There are no Patient  Instructions on file for this visit.   Signed, Fransico Him, MD  07/28/2019 12:06 AM    [address], [place], [place]  [address] Phone: [phone]; Fax: [phone]

## 2019-08-06 NOTE — Progress Notes (Signed)
Cardiology Office Note:   Date:  08/07/2019  NAME:  Mark English    MRN: [id number] DOB:  1951-01-28   PCP:  Patient, No Pcp Per  Cardiologist:  Armanda Magic, MD   Referring MD: Dois Davenport, MD   Chief Complaint  Patient presents with  . Chest Pain   History of Present Illness:   Mark English is a 69 y.o. male with a hx of DM, HTN who is being seen today for the evaluation of palpitations at the request of Hal Hope, Marcos Eke, MD.  He reports he was recently evaluated by his primary care physician and noted to have an irregular heartbeat.  He reports has been unaware of this.  He describes no symptoms.  He reports he is not aware of his heart beating irregularly.  He does have high blood pressure and his blood pressure is not controlled today, 160/68.  He unfortunately did not bring any list of medications.  He also reports he sees another physician for some other entity but he is unable to tell me what he takes or what he sees disposition for.  He is recently retired in Office manager.  He is retired Electronics engineer as well.  He reports he does not exercise routinely but has no limitations with his current level of activity.  This includes doing things around the house or getting around town.  His EKG today demonstrates frequent PACs.  He reports he is unaware of this.  He denies symptoms of chest pain shortness of breath or palpitations in office.  He reports maybe he is aware of some extra heartbeats every now and then but this does not appear to be bothering him.  He reports he does take insulin but he is unaware of if his diabetes is controlled or not.  We will need to get records from his primary care physician.  He is never had any history of heart disease.  He denies chest pain.  He is a former smoker but quit a long time ago.  He reports he rarely consumes alcohol.  No illicit drug use reported.  He reports medications may have been changed recently but he is not able to tell me which ones.  No  strong family history of heart disease reported.  Past Medical History: Past Medical History:  Diagnosis Date  . Atrioventricular block, Mobitz type 2   . Diabetes mellitus without complication (HCC)   . Disease of intestine   . Hypertension   . Irregular heart beat   . Type 2 diabetes mellitus with hyperglycemia (HCC)   . Unspecified urinary incontinence     Past Surgical History: Past Surgical History:  Procedure Laterality Date  . 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 aspirin 81 MG chewable tablet Chew by mouth daily.  Marland Kitchen Ertugliflozin L-PyroglutamicAc (STEGLATRO) 5 MG TABS Take 1 tablet by mouth daily.     Allergies:    Vicodin [hydrocodone-acetaminophen]   Social History: Social History   Socioeconomic History  . Marital status: Married    Spouse name: Not on file  . Number of children: 5  . Years of education: Not on file  . Highest education level: Not on file  Occupational History  . Occupation: retired Office manager  Tobacco Use  . Smoking status: Never Smoker  . Smokeless tobacco: Never Used  Substance and Sexual Activity  . Alcohol use: Not Currently  .  Drug use: No  . Sexual activity: Not on file  Other Topics Concern  . Not on file  Social History Narrative  . Not on file   Social Determinants of Health   Financial Resource Strain:   . Difficulty of Paying Living Expenses:   Food Insecurity:   . Worried About Charity fundraiser in the Last Year:   . Arboriculturist in the Last Year:   Transportation Needs:   . Film/video editor (Medical):   Marland Kitchen Lack of Transportation (Non-Medical):   Physical Activity:   . Days of Exercise per Week:   . Minutes of Exercise per Session:   Stress:   . Feeling of Stress :   Social Connections:   . Frequency of Communication with Friends and Family:   . Frequency of Social Gatherings with Friends and Family:   . Attends Religious  Services:   . Active Member of Clubs or Organizations:   . Attends Archivist Meetings:   Marland Kitchen Marital Status:      Family History: The patient's family history includes Diabetes in his mother; Heart disease in his father.  ROS:   All other ROS reviewed and negative. Pertinent positives noted in the HPI.     EKGs/Labs/Other Studies Reviewed:   The following studies were personally reviewed by me today:  EKG:  EKG is ordered today.  The ekg ordered today demonstrates normal sinus rhythm, heart rate 77, PACs noted, likely LVH by voltage, old anteroseptal infarct which can be seen in setting of LVH and hypertension, and was personally reviewed by me.   Recent Labs: 03/10/2019: BUN 14; Creatinine, Ser 0.84; Hemoglobin 16.8; Platelets 220; Potassium 3.9; Sodium 134   Recent Lipid Panel No results found for: CHOL, TRIG, HDL, CHOLHDL, VLDL, LDLCALC, LDLDIRECT  Physical Exam:   VS:  BP (!) 160/68   Pulse 77   Ht 5\' 10"  (1.778 m)   Wt 169 lb 9.6 oz (76.9 kg)   SpO2 98%   BMI 24.34 kg/m    Wt Readings from Last 3 Encounters:  08/07/19 169 lb 9.6 oz (76.9 kg)  03/10/19 170 lb (77.1 kg)  11/24/12 155 lb (70.3 kg)    General: Well nourished, well developed, in no acute distress Heart: Atraumatic, normal size  Eyes: PEERLA, EOMI  Neck: Supple, no JVD Endocrine: No thryomegaly Cardiac: Normal S1, S2; RRR; irregular rhythm noted, likely PACs Lungs: Clear to auscultation bilaterally, no wheezing, rhonchi or rales  Abd: Soft, nontender, no hepatomegaly  Ext: No edema, pulses 2+ Musculoskeletal: No deformities, BUE and BLE strength normal and equal Skin: Warm and dry, no rashes   Neuro: Alert and oriented to person, place, time, and situation, CNII-XII grossly intact, no focal deficits  Psych: Normal mood and affect   ASSESSMENT:   Mark English is a 69 y.o. male who presents for the following: 1. Palpitations   2. Nonspecific abnormal electrocardiogram (ECG) (EKG)   3.  PAC (premature atrial contraction)   4. Essential hypertension     PLAN:   1. Palpitations 2. PAC (premature atrial contraction) -EKG with PACs today.  Blood pressure poorly controlled.  I suspect this is either related to his poorly controlled blood pressure or possibly sleep apnea.  He reports he does snore but does not describe excessive fatigue.  We will proceed with lab work today which will include a BMP and TSH.  We will need to make sure his thyroid studies are normal.  It  is a bit difficult to change medications as he is unaware what he is taking.  He will need to bring this information when he sees [place] next.  I have also recommended a 3-day Zio patch to quantify his PAC burden as well as to exclude any atrial fibrillation.  He also needs to obtain an echocardiogram.  He will do this over the next few weeks.  We will plan to see him back in 3 months after the above testing.  3. Essential hypertension -Not controlled.  Unclear what medications he is taking.  He reports he did take them.  I am a little hesitant to change any medications today.  When I see him back in 3 months we can discuss this further.  I would also like to see all of his blood work from his primary care physician.   Disposition: Return in about 3 months (around 11/07/2019).  Medication Adjustments/Labs and Tests Ordered: Current medicines are reviewed at length with the patient today.  Concerns regarding medicines are outlined above.  Orders Placed This Encounter  Procedures  . Basic metabolic panel  . TSH  . LONG TERM MONITOR (3-14 DAYS)  . EKG 12-Lead  . ECHOCARDIOGRAM COMPLETE   No orders of the defined types were placed in this encounter.   Patient Instructions  Medication Instructions:  The current medical regimen is effective;  continue present plan and medications.  *If you need a refill on your cardiac medications before your next appointment, please call your pharmacy*   Lab Work: TSH, BMET today     If you have labs (blood work) drawn today and your tests are completely normal, you will receive your results only by: Marland Kitchen MyChart Message (if you have MyChart) OR . A paper copy in the mail If you have any lab test that is abnormal or we need to change your treatment, we will call you to review the results.   Testing/Procedures: Echocardiogram - Your physician has requested that you have an echocardiogram. Echocardiography is a painless test that uses sound waves to create images of your heart. It provides your doctor with information about the size and shape of your heart and how well your heart's chambers and valves are working. This procedure takes approximately one hour. There are no restrictions for this procedure. This will be performed at our [facility] location - [address], [address].  Your physician has recommended that you wear a 3 DAY ZIO-PATCH monitor. The Zio patch cardiac monitor continuously records heart rhythm data for up to 14 days, this is for patients being evaluated for multiple types heart rhythms. For the first 24 hours post application, please avoid getting the Zio monitor wet in the shower or by excessive sweating during exercise. After that, feel free to carry on with regular activities. Keep soaps and lotions away from the ZIO XT Patch.  This will be mailed to you, please expect 7-10 days to receive.          Follow-Up: At [facility] you and your health needs are our priority.  As part of our continuing mission to provide you with exceptional heart care, we have created designated Provider Care Teams.  These Care Teams include your primary Cardiologist (physician) and Advanced Practice Providers (APPs -  Physician Assistants and Nurse Practitioners) who all work together to provide you with the care you need, when you need it.  We recommend signing up for the patient portal called "MyChart".  Sign up  information is provided on this After Visit Summary.   MyChart is used to connect with patients for Virtual Visits (Telemedicine).  Patients are able to view lab/test results, encounter notes, upcoming appointments, etc.  Non-urgent messages can be sent to your provider as well.   To learn more about what you can do with MyChart, go to [email or web address].    Your next appointment:   August 25th, 2021 @ 8:40     Signed, Lenna Gilford. Flora Lipps, MD [facility]  [address] [place], [place] [address] [phone]  08/07/2019 9:38 AM

## 2019-08-07 ENCOUNTER — Ambulatory Visit (INDEPENDENT_AMBULATORY_CARE_PROVIDER_SITE_OTHER): Payer: Medicare Other | Admitting: Cardiovascular Disease

## 2019-08-07 ENCOUNTER — Telehealth: Payer: Self-pay | Admitting: Radiology

## 2019-08-07 ENCOUNTER — Other Ambulatory Visit: Payer: Self-pay

## 2019-08-07 ENCOUNTER — Encounter: Payer: Self-pay | Admitting: Cardiovascular Disease

## 2019-08-07 VITALS — BP 160/68 | HR 77 | Ht 70.0 in | Wt 169.6 lb

## 2019-08-07 DIAGNOSIS — R9431 Abnormal electrocardiogram [ECG] [EKG]: Secondary | ICD-10-CM | POA: Diagnosis not present

## 2019-08-07 DIAGNOSIS — I491 Atrial premature depolarization: Secondary | ICD-10-CM

## 2019-08-07 DIAGNOSIS — R002 Palpitations: Secondary | ICD-10-CM | POA: Diagnosis not present

## 2019-08-07 DIAGNOSIS — I10 Essential (primary) hypertension: Secondary | ICD-10-CM | POA: Diagnosis not present

## 2019-08-07 LAB — BASIC METABOLIC PANEL
BUN/Creatinine Ratio: 25 — ABNORMAL HIGH (ref 10–24)
BUN: 19 mg/dL (ref 8–27)
CO2: 21 mmol/L (ref 20–29)
Calcium: 9.3 mg/dL (ref 8.6–10.2)
Chloride: 97 mmol/L (ref 96–106)
Creatinine, Ser: 0.75 mg/dL — ABNORMAL LOW (ref 0.76–1.27)
GFR calc Af Amer: 110 mL/min/{1.73_m2} (ref >59)
GFR calc non Af Amer: 95 mL/min/{1.73_m2} (ref >59)
Glucose: 159 mg/dL — ABNORMAL HIGH (ref 65–99)
Potassium: 4.6 mmol/L (ref 3.5–5.2)
Sodium: 133 mmol/L — ABNORMAL LOW (ref 134–144)

## 2019-08-07 LAB — TSH: TSH: 1.82 u[IU]/mL (ref 0.450–4.500)

## 2019-08-07 NOTE — Patient Instructions (Addendum)
Medication Instructions:  The current medical regimen is effective;  continue present plan and medications.  *If you need a refill on your cardiac medications before your next appointment, please call your pharmacy*   Lab Work: TSH, BMET today   If you have labs (blood work) drawn today and your tests are completely normal, you will receive your results only by: Marland Kitchen MyChart Message (if you have MyChart) OR . A paper copy in the mail If you have any lab test that is abnormal or we need to change your treatment, we will call you to review the results.   Testing/Procedures: Echocardiogram - Your physician has requested that you have an echocardiogram. Echocardiography is a painless test that uses sound waves to create images of your heart. It provides your doctor with information about the size and shape of your heart and how well your heart's chambers and valves are working. This procedure takes approximately one hour. There are no restrictions for this procedure. This will be performed at our [facility] location - [address].  Your physician has recommended that you wear a 3 DAY ZIO-PATCH monitor. The Zio patch cardiac monitor continuously records heart rhythm data for up to 14 days, this is for patients being evaluated for multiple types heart rhythms. For the first 24 hours post application, please avoid getting the Zio monitor wet in the shower or by excessive sweating during exercise. After that, feel free to carry on with regular activities. Keep soaps and lotions away from the ZIO XT Patch.  This will be mailed to you, please expect 7-10 days to receive.          Follow-Up: At [facility], you and your health needs are our priority.  As part of our continuing mission to provide you with exceptional heart care, we have created designated Provider Care Teams.  These Care Teams include your primary Cardiologist (physician) and Advanced Practice Providers (APPs -   Physician Assistants and Nurse Practitioners) who all work together to provide you with the care you need, when you need it.  We recommend signing up for the patient portal called "MyChart".  Sign up information is provided on this After Visit Summary.  MyChart is used to connect with patients for Virtual Visits (Telemedicine).  Patients are able to view lab/test results, encounter notes, upcoming appointments, etc.  Non-urgent messages can be sent to your provider as well.   To learn more about what you can do with MyChart, go to [email or web address].    Your next appointment:   August 25th, 2021 @ 8:40

## 2019-08-07 NOTE — Telephone Encounter (Signed)
Enrolled patient for a 3 day Zio monitor to be mailed to patients home.  

## 2019-08-13 ENCOUNTER — Ambulatory Visit (INDEPENDENT_AMBULATORY_CARE_PROVIDER_SITE_OTHER): Payer: Medicare Other

## 2019-08-13 DIAGNOSIS — R002 Palpitations: Secondary | ICD-10-CM

## 2019-08-27 ENCOUNTER — Other Ambulatory Visit: Payer: Self-pay

## 2019-08-27 ENCOUNTER — Ambulatory Visit (HOSPITAL_COMMUNITY): Payer: Medicare Other | Attending: Cardiovascular Disease

## 2019-08-27 DIAGNOSIS — R002 Palpitations: Secondary | ICD-10-CM

## 2019-08-27 DIAGNOSIS — R9431 Abnormal electrocardiogram [ECG] [EKG]: Secondary | ICD-10-CM | POA: Diagnosis not present

## 2019-08-27 DIAGNOSIS — I491 Atrial premature depolarization: Secondary | ICD-10-CM | POA: Insufficient documentation

## 2019-09-08 ENCOUNTER — Other Ambulatory Visit: Payer: Self-pay

## 2019-09-08 ENCOUNTER — Telehealth: Payer: Self-pay | Admitting: Cardiovascular Disease

## 2019-09-08 MED ORDER — METOPROLOL SUCCINATE ER 25 MG PO TB24: 25.0000 mg | ORAL_TABLET | Freq: Every day | ORAL | 1 refills | Status: DC

## 2019-09-08 NOTE — Telephone Encounter (Signed)
Called patient gave results.   Patient verbalized understanding.    

## 2019-09-08 NOTE — Telephone Encounter (Signed)
Patient returning call for heart monitor results. 

## 2019-11-02 NOTE — Progress Notes (Signed)
Cardiology Office Note:   Date:  11/04/2019  NAME:  Mark English    MRN: [id number] DOB:  05-09-1950   PCP:  Dois Davenport, MD  Cardiologist:  Armanda Magic, MD  Electrophysiologist:  None   Referring MD: No ref. provider found   Chief Complaint  Patient presents with   Follow-up    History of Present Illness:   Mark English is a 69 y.o. male with a hx of diabetes, hypertension, PACs who presents for follow-up of PACs.  Was evaluated several months ago for palpitations.  Monitor revealed frequent PACs.  The burden was 17.9%.  We started him on metoprolol he presents for follow-up today.  He reports has been doing well since her metoprolol.  He is unaware of his PACs.  He does have a high burden.  He again forgot his blood pressure medications.  Blood pressure was a bit elevated today.  We did discuss the importance of knowing what medications he is on.  He will call us back and give [place] his list.  He reports he had no chest pain or shortness of breath.  He does not do any structured exercise but has no limitations doing yard work or things around town.  He is unaware of any rapid heartbeat sensation or extra heartbeats.  Problem List 1. HTN 2. DM 3. 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   insulin lispro (HUMALOG) 100 UNIT/ML injection Inject into the skin in the morning and at bedtime.   metoprolol  succinate (TOPROL-XL) 25 MG 24 hr tablet Take 1 tablet (25 mg total) by mouth daily.   [DISCONTINUED] aspirin 81 MG chewable tablet Chew by mouth daily.     Allergies:    Vicodin [hydrocodone-acetaminophen]   Social History: Social History   Socioeconomic History   Marital status: Married    Spouse name: Not on file   Number of children: 5   Years of education: Not on file   Highest education level: Not on file  Occupational History   Occupation: retired Office manager  Tobacco Use   Smoking status: Never Smoker   Smokeless tobacco: Never Used  Substance and Sexual Activity   Alcohol use: Not Currently   Drug use: No   Sexual activity: Not on file  Other Topics Concern   Not on file  Social History Narrative   Not on file   Social Determinants of Health   Financial Resource Strain:    Difficulty of Paying Living Expenses: Not on file  Food Insecurity:    Worried About Programme researcher, broadcasting/film/video in the Last Year: Not on file   [employer] of Food in the Last Year: Not on file  Transportation Needs:    Lack of Transportation (Medical): Not on file   Lack of Transportation (Non-Medical): Not on file  Physical Activity:    Days of Exercise per  Week: Not on file   Minutes of Exercise per Session: Not on file  Stress:    Feeling of Stress : Not on file  Social Connections:    Frequency of Communication with Friends and Family: Not on file   Frequency of Social Gatherings with Friends and Family: Not on file   Attends Religious Services: Not on file   Active Member of Clubs or Organizations: Not on file   Attends Banker Meetings: Not on file   Marital Status: Not on file     Family History: The patient's family history includes Diabetes in his mother; Heart disease in his father.  ROS:   All other ROS reviewed and negative. Pertinent positives noted in the HPI.     EKGs/Labs/Other Studies Reviewed:   The following studies were personally  reviewed by me today:  [facility] 09/08/2019 Enrollment 08/13/2019-08/16/2019 (2 days 23 hours). Patient had a min HR of 58 bpm (sinus bradycardia), max HR of 207 bpm (supraventricular tachycardia, 5 beats, 1.6 second duration), and avg HR of 85 bpm (normal sinus rhythm). Predominant underlying rhythm was Sinus Rhythm. 1 run of non-sustained Ventricular Tachycardia occurred lasting 4 beats (2.1 second duration) with a max rate of 111 bpm (avg 104 bpm). 79 Supraventricular Tachycardia runs occurred, the run with the fastest interval lasting 5 beats (1.6 seconds) with a max rate of 207 bpm, the longest lasting 12 beats (4.4 seconds) with an avg rate of 161 bpm. Episodes are likely brief atrial tachycardia runs. Isolated SVEs were frequent (12.9%, 47380), SVE Couplets were occasional (3.6%, 6583), and SVE Triplets were occasional (1.4%, 1723). Isolated VEs were occasional (1.4%, 5195), VE Couplets were rare (<1.0%, 357), and no VE Triplets were present. No diary submitted. No atrial fibrillation.   Impression:  1. Brief atrial tachycardia episodes (longest duration 4.4 seconds).  2. Frequent PACs (PAC burden 17.9%).  3. No atrial fibrillation.   TTE 08/27/2019 1. Left ventricular ejection fraction, by estimation, is 60 to 65%. The  left ventricle has normal function. The left ventricle has no regional  wall motion abnormalities. There is mild concentric left ventricular  hypertrophy. Left ventricular diastolic  parameters are consistent with Grade I diastolic dysfunction (impaired  relaxation).  2. Right ventricular systolic function is normal. The right ventricular  size is normal. Tricuspid regurgitation signal is inadequate for assessing  PA pressure.  3. The mitral valve is grossly normal. Trivial mitral valve  regurgitation. No evidence of mitral stenosis.  4. The aortic valve is tricuspid. Aortic valve regurgitation is not  visualized. No aortic stenosis is present.  5. The inferior vena cava is  normal in size with greater than 50%  respiratory variability, suggesting right atrial pressure of 3 mmHg.   Recent Labs: 03/10/2019: Hemoglobin 16.8; Platelets 220 08/07/2019: BUN 19; Creatinine, Ser 0.75; Potassium 4.6; Sodium 133; TSH 1.820   Recent Lipid Panel No results found for: CHOL, TRIG, HDL, CHOLHDL, VLDL, LDLCALC, LDLDIRECT  Physical Exam:   VS:  BP (!) 152/82 (BP Location: Left Arm, Patient Position: Sitting, Cuff Size: Normal)    Pulse 74    Ht 5\' 10"  (1.778 m)    Wt 177 lb 9.6 oz (80.6 kg)    BMI 25.48 kg/m    Wt Readings from Last 3 Encounters:  11/04/19 177 lb 9.6 oz (80.6 kg)  08/07/19 169 lb 9.6 oz (76.9 kg)  03/10/19 170 lb (77.1 kg)    General: Well nourished, well developed, in no acute distress Heart: Atraumatic,  normal size  Eyes: PEERLA, EOMI  Neck: Supple, no JVD Endocrine: No thryomegaly Cardiac: Normal S1, S2; RRR; no murmurs, rubs, or gallops Lungs: Clear to auscultation bilaterally, no wheezing, rhonchi or rales  Abd: Soft, nontender, no hepatomegaly  Ext: No edema, pulses 2+ Musculoskeletal: No deformities, BUE and BLE strength normal and equal Skin: Warm and dry, no rashes   Neuro: Alert and oriented to person, place, time, and situation, CNII-XII grossly intact, no focal deficits  Psych: Normal mood and affect   ASSESSMENT:   Mark English is a 69 y.o. male who presents for the following: 1. Palpitations   2. PAC (premature atrial contraction)   3. Essential hypertension     PLAN:   1. Palpitations 2. PAC (premature atrial contraction) -17.9% PAC burden.  No major symptoms.  Tolerating metoprolol well.  Echo with normal EF.  No A. fib.  We will plan to continue his metoprolol for now.  He will call us back with his blood pressure medications.  Poorly controlled blood pressure can result more PACs.  No concern for sleep apnea.  No chest pain.  No limitations.  We will continue to monitor him on a yearly basis.  3. Essential  hypertension -Blood pressure elevated today.  He is unclear what he takes.  He will call us back and let us know.  We may need to modify his regimen.  Disposition: Return in about 1 year (around 11/03/2020).  Medication Adjustments/Labs and Tests Ordered: Current medicines are reviewed at length with the patient today.  Concerns regarding medicines are outlined above.  No orders of the defined types were placed in this encounter.  No orders of the defined types were placed in this encounter.   Patient Instructions  Medication Instructions:  Your physician recommends that you continue on your current medications as directed. Please refer to the Current Medication list given to you today.  --call when you get home to let us know what medications you are taking  *If you need a refill on your cardiac medications before your next appointment, please call your pharmacy*    Follow-Up: At [facility], you and your health needs are our priority.  As part of our continuing mission to provide you with exceptional heart care, we have created designated Provider Care Teams.  These Care Teams include your primary Cardiologist (physician) and Advanced Practice Providers (APPs -  Physician Assistants and Nurse Practitioners) who all work together to provide you with the care you need, when you need it.  We recommend signing up for the patient portal called "MyChart".  Sign up information is provided on this After Visit Summary.  MyChart is used to connect with patients for Virtual Visits (Telemedicine).  Patients are able to view lab/test results, encounter notes, upcoming appointments, etc.  Non-urgent messages can be sent to your provider as well.   To learn more about what you can do with MyChart, go to [email or web address].    Your next appointment:   12 month(s)  The format for your next appointment:   In Person  Provider:   Lennie OdorWesley O'Neal, MD        Time Spent with Patient: I have  spent a total of 25 minutes with patient reviewing hospital notes, telemetry, EKGs, labs and examining the patient as well as establishing an assessment and plan that was discussed with the patient.  > 50% of time was spent in direct patient care.  Signed, Lenna GilfordWesley T. Flora Lipps'Neal, MD [facility]  [address] [place], [place] [address] [phone]  11/04/2019 10:17 AM

## 2019-11-04 ENCOUNTER — Encounter: Payer: Self-pay | Admitting: Cardiovascular Disease

## 2019-11-04 ENCOUNTER — Ambulatory Visit (INDEPENDENT_AMBULATORY_CARE_PROVIDER_SITE_OTHER): Payer: Medicare Other | Admitting: Cardiovascular Disease

## 2019-11-04 ENCOUNTER — Other Ambulatory Visit: Payer: Self-pay

## 2019-11-04 VITALS — BP 152/82 | HR 74 | Ht 70.0 in | Wt 177.6 lb

## 2019-11-04 DIAGNOSIS — I10 Essential (primary) hypertension: Secondary | ICD-10-CM

## 2019-11-04 DIAGNOSIS — R002 Palpitations: Secondary | ICD-10-CM | POA: Diagnosis not present

## 2019-11-04 DIAGNOSIS — I491 Atrial premature depolarization: Secondary | ICD-10-CM | POA: Diagnosis not present

## 2019-11-04 NOTE — Patient Instructions (Signed)
Medication Instructions:  Your physician recommends that you continue on your current medications as directed. Please refer to the Current Medication list given to you today.  --call when you get home to let us know what medications you are taking  *If you need a refill on your cardiac medications before your next appointment, please call your pharmacy*    Follow-Up: At [facility], you and your health needs are our priority.  As part of our continuing mission to provide you with exceptional heart care, we have created designated Provider Care Teams.  These Care Teams include your primary Cardiologist (physician) and Advanced Practice Providers (APPs -  Physician Assistants and Nurse Practitioners) who all work together to provide you with the care you need, when you need it.  We recommend signing up for the patient portal called "MyChart".  Sign up information is provided on this After Visit Summary.  MyChart is used to connect with patients for Virtual Visits (Telemedicine).  Patients are able to view lab/test results, encounter notes, upcoming appointments, etc.  Non-urgent messages can be sent to your provider as well.   To learn more about what you can do with MyChart, go to [email or web address].    Your next appointment:   12 month(s)  The format for your next appointment:   In Person  Provider:   Lennie Odor, MD

## 2020-01-21 ENCOUNTER — Other Ambulatory Visit: Payer: Self-pay

## 2020-01-21 DIAGNOSIS — R569 Unspecified convulsions: Secondary | ICD-10-CM

## 2020-01-27 ENCOUNTER — Other Ambulatory Visit: Payer: Self-pay

## 2020-01-27 ENCOUNTER — Ambulatory Visit (INDEPENDENT_AMBULATORY_CARE_PROVIDER_SITE_OTHER): Payer: Medicare Other | Admitting: Neurology

## 2020-01-27 DIAGNOSIS — R569 Unspecified convulsions: Secondary | ICD-10-CM

## 2020-02-03 NOTE — Procedures (Signed)
ELECTROENCEPHALOGRAM REPORT  Date of Study: 01/27/2020  Patient's Name: Mark English MRN: [id number] Date of Birth: Aug 03, 1950  Referring Provider: Dr. Nadyne Coombes  Clinical History: This is a 69 year old man with sudden onset spasms of the right upper extremity lasting seconds.   Medications: Insulin Losartan Famotidine Donepezil Memantine Hydroxyzine Metoprolol  Technical Summary: A multichannel digital EEG recording measured by the international 10-20 system with electrodes applied with paste and impedances below 5000 ohms performed in our laboratory with EKG monitoring in an awake and asleep patient.  Hyperventilation was not performed. Photic stimulation was performed.  The digital EEG was referentially recorded, reformatted, and digitally filtered in a variety of bipolar and referential montages for optimal display.    Description: The patient is awake and asleep during the recording.  During maximal wakefulness, there is a symmetric, medium voltage 9 Hz posterior dominant rhythm that attenuates with eye opening.  The record is symmetric.  During drowsiness and sleep, there is an increase in theta slowing of the background.  Vertex waves and symmetric sleep spindles were seen.  Photic stimulation did not elicit any abnormalities. At 15:13 hours, periodic low voltage small sharp waves are seen over the left temporal region occurring at a frequency of 1/second. This lasts for 28 seconds followed by rhythmic 3 Hz activity over the left temporal region with spread over the left parasagittal region lasting 35 seconds. Patient is lying on bed with no clinical changes seen. There were no interictal epileptiform discharges seen.   EKG lead was unremarkable.  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  Clinical Correlation of the above findings indicates a definite tendency for  seizures to arise from the left temporal region. There were no clinical changes associated with EEG changes (electrographic seizure).    Patrcia Dolly, M.D.

## 2020-02-23 ENCOUNTER — Other Ambulatory Visit: Payer: Self-pay

## 2020-02-23 ENCOUNTER — Encounter: Payer: Self-pay | Admitting: Neurology

## 2020-02-23 ENCOUNTER — Ambulatory Visit (INDEPENDENT_AMBULATORY_CARE_PROVIDER_SITE_OTHER): Payer: Medicare Other | Admitting: Neurology

## 2020-02-23 VITALS — BP 131/82 | HR 112 | Ht 70.0 in | Wt 172.8 lb

## 2020-02-23 DIAGNOSIS — R569 Unspecified convulsions: Secondary | ICD-10-CM

## 2020-02-23 MED ORDER — LEVETIRACETAM 500 MG PO TABS: 500.0000 mg | ORAL_TABLET | Freq: Two times a day (BID) | ORAL | 6 refills | Status: DC

## 2020-02-23 NOTE — Patient Instructions (Addendum)
1. Schedule MRI brain with and without contrast  2. Continue Keppra 500mg  twice a day  3. Keep a calendar of your seizures  4. Follow-up in 3 months, call for any changes   Seizure Precautions: 1. If medication has been prescribed for you to prevent seizures, take it exactly as directed.  Do not stop taking the medicine without talking to your doctor first, even if you have not had a seizure in a long time.   2. Avoid activities in which a seizure would cause danger to yourself or to others.  Don't operate dangerous machinery, swim alone, or climb in high or dangerous places, such as on ladders, roofs, or girders.  Do not drive unless your doctor says you may.  3. If you have any warning that you may have a seizure, lay down in a safe place where you can't hurt yourself.    4.  No driving for 6 months from last seizure, as per [facility].   Please refer to the following link on the Epilepsy Foundation of America's website for more information: http://www.epilepsyfoundation.org/answerplace/Social/driving/drivingu.cfm   5.  Maintain good sleep hygiene. Avoid alcohol.  6.  Contact your doctor if you have any problems that may be related to the medicine you are taking.  7.  Call 911 and bring the patient back to the ED if:        A.  The seizure lasts longer than 5 minutes.       B.  The patient doesn't awaken shortly after the seizure  C.  The patient has new problems such as difficulty seeing, speaking or moving  D.  The patient was injured during the seizure  E.  The patient has a temperature over 102 F (39C)  F.  The patient vomited and now is having trouble breathing

## 2020-02-23 NOTE — Progress Notes (Signed)
NEUROLOGY CONSULTATION NOTE  Mark English MRN: [id number] DOB: [birth year]  Referring provider: Dr. Nadyne Coombes Primary care provider: Dr. Nadyne Coombes  Reason for consult:  seizure  Dear Dr Hal Hope:  Thank you for your kind referral of Mark English for consultation of the above symptoms. Although his history is well known to you, please allow me to reiterate it for the purpose of our medical record. The patient was accompanied to the clinic by his wife who also provides collateral information. Records and images were personally reviewed where available.   HISTORY OF PRESENT ILLNESS: This is a 69 year old left-handed man with a history of hypertension, diabetes, AV block, cognitive impairment, chronic PTSD, presenting for evaluation of focal seizures. He states symptoms have been going on for years, but became more noticeable in the past year or so. He describes episodes starting with a sudden "real serious sense of dread." His voice would change, it sounds like he is almost ready to cry, like he is straining to speak, then almost immediately his right arm becomes hard to control. If he is holding a drink, it would spill. His hand is like a claw, then goes in a fist. He can voluntarily flex at the elbow and hold it his arm to his shoulder until the episode subsides in a couple of minutes. There is no associated loss of awareness, his wife denies any staring/unresponsive episodes. No focal jerking, numbness/tingling/post-event weakness. They can occur multiple times a day, longest seizure-free interval of 1-2 days. He has noticed that when he is playing computer games, he can have 3-4 episodes within 10 minutes. His wife has not noticed any nocturnal episodes. Face and leg are not affected. He denies any gaps in time, olfactory/gustatory hallucinations, myoclonic jerks. He has rare episodes of deja vu. He denies any headaches, dizziness, diplopia, dysarthria/dysphagia, neck/back pain,  bowel/bladder dysfunction. He had an EEG done 01/27/20 which captured a focal electrographic seizure arising from the left temporal region. He started Levetiracetam 500mg  BID 2 weeks ago and feels there "may be" a little reduction with the seizures, they are not as often as they used to be. He denies any side effects however his wife notes he is " ."   He has had memory issues since his last 40s/early 33s. His wife started noticing changes 3-4 years ago. He forgets conversations. His wife reports he puts dishes in the wrong place despite repeated instructions. He forgets things they did or places they went. He denies getting lost driving. He occasionally forgets his medications. His wife manages finances. His parents have memory problems. He denies any alcohol use. 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 His wife is concerned he was exposed to an extremely small amount of [place] gas in the [employer] war in [place]. 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 His wife reports he is extremely restless when he sleeps, talks in his sleep. He has a diagnosis of PTSD. He is on Donepezil and Memantine.   Epilepsy Risk Factors:  He had a normal birth and early development.  There is no history of febrile convulsions, CNS infections such as meningitis/encephalitis, significant traumatic brain injury, neurosurgical procedures, or family history of seizures.   PAST MEDICAL HISTORY: Past Medical History:  Diagnosis Date  . Atrioventricular block, Mobitz type 2   . Diabetes mellitus without complication (HCC)   . Disease  of intestine   . Hypertension   . Irregular heart beat   . Type 2 diabetes mellitus with hyperglycemia (HCC)   . Unspecified urinary incontinence     PAST SURGICAL HISTORY: Past Surgical History:  Procedure Laterality Date  . 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 Ertugliflozin L-PyroglutamicAc (STEGLATRO) 5 MG TABS Take 1 tablet by mouth daily.    . insulin lispro (HUMALOG) 100 UNIT/ML injection Inject into the skin in the morning and at bedtime.    . memantine (NAMENDA) 5 MG tablet     . metoprolol succinate (TOPROL-XL) 25 MG 24 hr tablet Take 1 tablet (25 mg total) by mouth daily. 90 tablet 1  . Vitamin D, Ergocalciferol, (DRISDOL) 1.25 MG (50000 UNIT) CAPS capsule Take 50,000 Units by mouth every 7 (seven) days.     No current facility-administered medications on file prior to visit.    ALLERGIES: Allergies  Allergen Reactions  . Vicodin [Hydrocodone-Acetaminophen] Other (See Comments)    Blood pressure dropped    FAMILY HISTORY: Family History  Problem Relation Age of Onset  . Diabetes Mother   . Heart disease Father     SOCIAL HISTORY: Social History   Socioeconomic History  . Marital status: Married    Spouse name: Not on file  . Number of children: 5  . Years of education: Not on file  . Highest education level: Not on file  Occupational History  . Occupation: retired Office manager  Tobacco Use  . Smoking status: Never Smoker  . Smokeless tobacco: Never Used  Vaping Use  . Vaping Use: Never used  Substance and Sexual Activity  . Alcohol use: Not Currently  . Drug use: No  . Sexual activity: Not on file  Other Topics Concern  . Not on file  Social History Narrative   Left handed    Lives with wife   Social Determinants of Health   Financial Resource Strain: Not on file  Food Insecurity: Not on file  Transportation Needs: Not on file  Physical Activity: Not on file  Stress: Not on file  Social Connections: Not on file  Intimate Partner Violence: Not on file     PHYSICAL EXAM: Vitals:   02/23/20 1257  BP: 131/82  Pulse: (!) 112  SpO2: 98%   General: No acute distress Head:   Normocephalic/atraumatic Skin/Extremities: No rash, no edema Neurological Exam: Mental status: alert and oriented to person, place, and time, no dysarthria or aphasia, Fund of knowledge is appropriate.  Recent and remote memory are intact.  Attention and concentration are normal.    Able to name objects and repeat phrases. Cranial nerves: CN I: not tested CN II: pupils equal, round and reactive to light, visual fields intact CN III, IV, VI:  full range of motion, no nystagmus, no ptosis CN V: facial sensation intact CN VII: upper and lower face symmetric CN VIII: hearing intact to conversation Bulk & Tone: normal, no fasciculations. Motor: 5/5 throughout with no pronator drift. Sensation: intact to light touch, cold, pin, vibration and joint position sense.  No extinction to double simultaneous stimulation.  Romberg test negative Deep Tendon Reflexes: +2 throughout Cerebellar: no incoordination on finger to nose, heel to shin. No dysdiadochokinesia Gait: narrow-based and steady, able to tandem walk adequately. Tremor: none   IMPRESSION: This is a 69 year old left-handed man with  a history of hypertension, diabetes, AV block, cognitive impairment, chronic PTSD, presenting for evaluation of focal seizures. He describes stereotyped episodes of a sense of dread, voice changes, then right hand stiffening. No loss of awareness. EEG captured a focal electrographic seizure arising from the left temporal region. MRI brain with and without contrast will be ordered to assess for underlying structural abnormality. He is not sure if there has been significant improvement in symptoms with initiation of Levetiracetam 500mg  BID, we discussed increasing dose however he would like to take medication longer and more regularly first and assess response. Monitor mood/side effects on Levetiracetam. [place] driving laws discussed, he knows to stop driving for any episode of loss of awareness/consciousness until 6 months  seizure-free. He was advised to keep a calendar of his seizures and follow-up in 3 months, they know to call for any changes.    Thank you for allowing me to participate in the care of this patient. Please do not hesitate to call for any questions or concerns.   , M.D.  CC: Dr. Patrcia Dolly

## 2020-03-24 ENCOUNTER — Telehealth: Payer: Self-pay

## 2020-03-24 ENCOUNTER — Ambulatory Visit
Admission: RE | Admit: 2020-03-24 | Discharge: 2020-03-24 | Disposition: A | Discharge status: Home / Self Care | Payer: Medicare Other | Source: Ambulatory Visit | Attending: Neurology | Admitting: Neurology

## 2020-03-24 ENCOUNTER — Other Ambulatory Visit: Payer: Self-pay

## 2020-03-24 DIAGNOSIS — R569 Unspecified convulsions: Secondary | ICD-10-CM

## 2020-03-24 IMAGING — MR MR HEAD WO/W CM
15 series · 47 of 48 positions shown · IV contrast (multihance)
Comparison: Head CT [DATE].

CLINICAL DATA: Seizure.

EXAM:
MRI HEAD WITHOUT AND WITH CONTRAST
TECHNIQUE: Multiplanar, multiecho pulse sequences of the brain and surrounding
structures were obtained without and with intravenous contrast.
CONTRAST:  15mL MULTIHANCE GADOBENATE DIMEGLUMINE 529 MG/ML IV SOLN

[Series 2: T1 · sagittal · 5.0mm · 0.45mm/px · 1 of 23 slices shown]
[im 1/23]
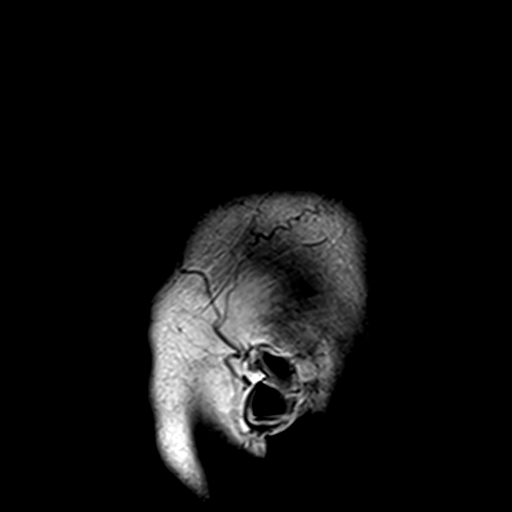

[Series 3: DWI · axial · 3.0mm · 1.80mm/px · z∈[-66,+81]mm · 5 of 100 slices shown (1 of 4)]
[im 1/100]
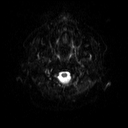
[im 25/100]
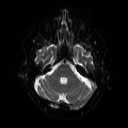
[im 50/100]
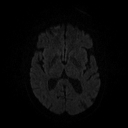
[im 75/100]
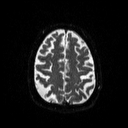
[im 100/100]
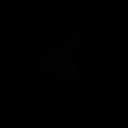

[Series 4: DWI · axial · 3.0mm · 1.80mm/px · z∈[-66,+81]mm · 3 of 50 slices shown (2 of 4)]
[im 1/50]
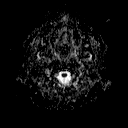
[im 25/50]
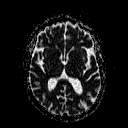
[im 50/50]
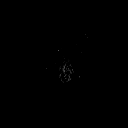

[Series 5: DWI · coronal · 5.0mm · 1.80mm/px · 4 of 68 slices shown (3 of 4)]
[im 1/68]
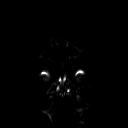
[im 23/68]
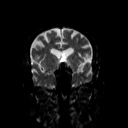
[im 45/68]
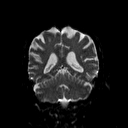
[im 68/68]
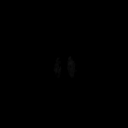

[Series 6: DWI · coronal · 5.0mm · 1.80mm/px · 2 of 34 slices shown (4 of 4)]
[im 1/34]
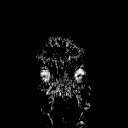
[im 34/34]
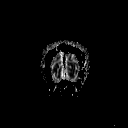

[Series 7: T2 · axial · 5.0mm · 0.60mm/px · 1 of 22 slices shown (1 of 3)]
[im 1/22]
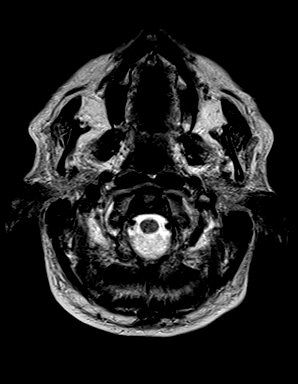

[Series 8: FLAIR · axial · 3.0mm · 0.45mm/px · z∈[-60,+75]mm · 2 of 30 slices shown (1 of 2)]
[im 1/30]
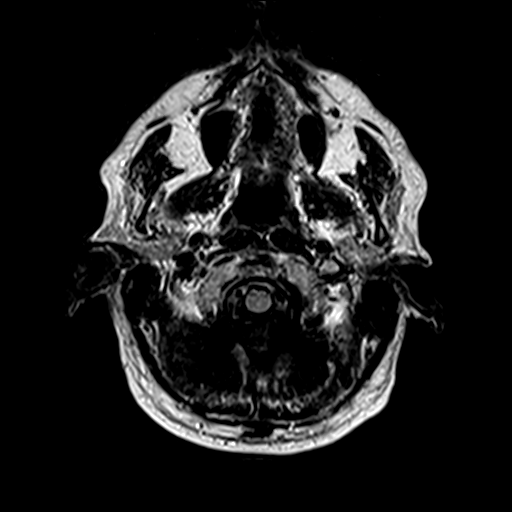
[im 30/30]
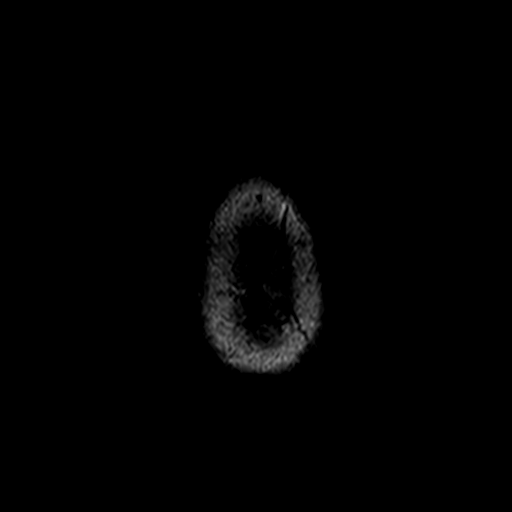

[Series 9: mip_images(sw) · axial · 32.0mm · 0.90mm/px · z∈[-48,+64]mm · 2 of 29 slices shown]
[im 1/29]
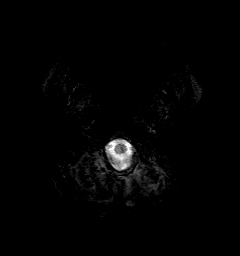
[im 29/29]
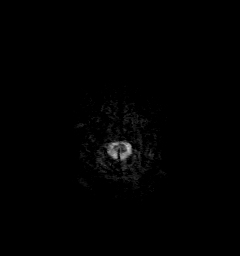

[Series 10: swi_images · axial · 4.0mm · 0.90mm/px · z∈[-62,+78]mm · 2 of 36 slices shown]
[im 1/36]
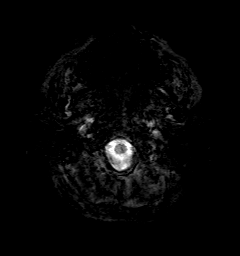
[im 36/36]
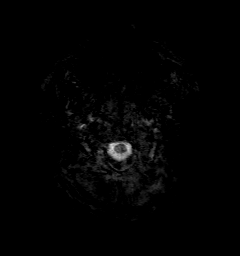

[Series 11: t1_mpr_tra · axial · 1.0mm · 0.75mm/px · z∈[-67,+76]mm · 9 of 144 slices shown (1 of 2)]
[im 1/144]
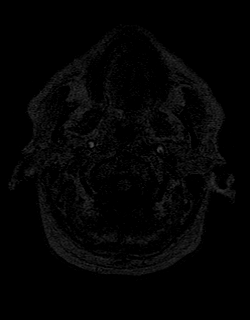
[im 18/144]
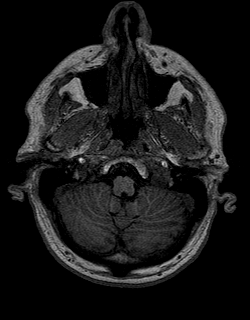
[im 36/144]
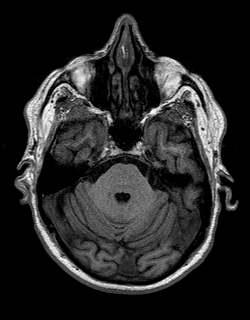
[im 54/144]
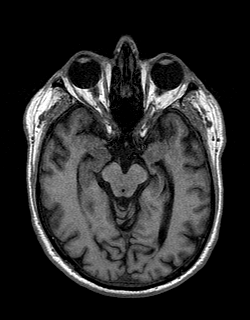
[im 72/144]
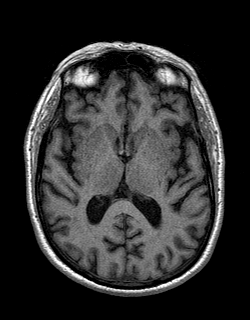
[im 90/144]
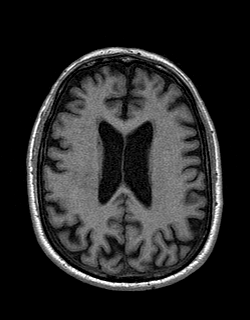
[im 108/144]
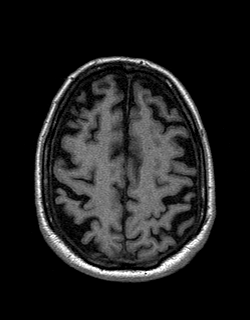
[im 126/144]
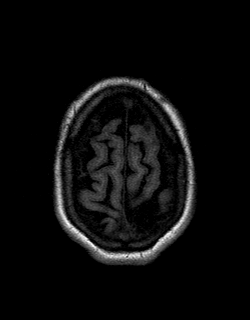
[im 144/144]
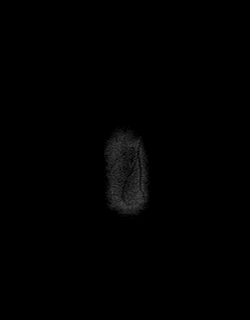

[Series 12: T2 · coronal · 3.0mm · 0.23mm/px · 2 of 28 slices shown (2 of 3)]
[im 1/28]
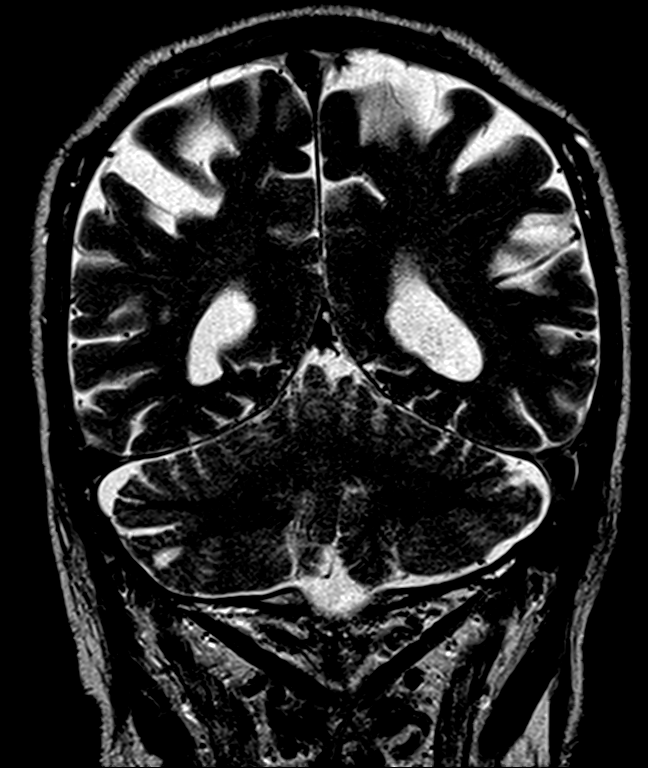
[im 28/28]
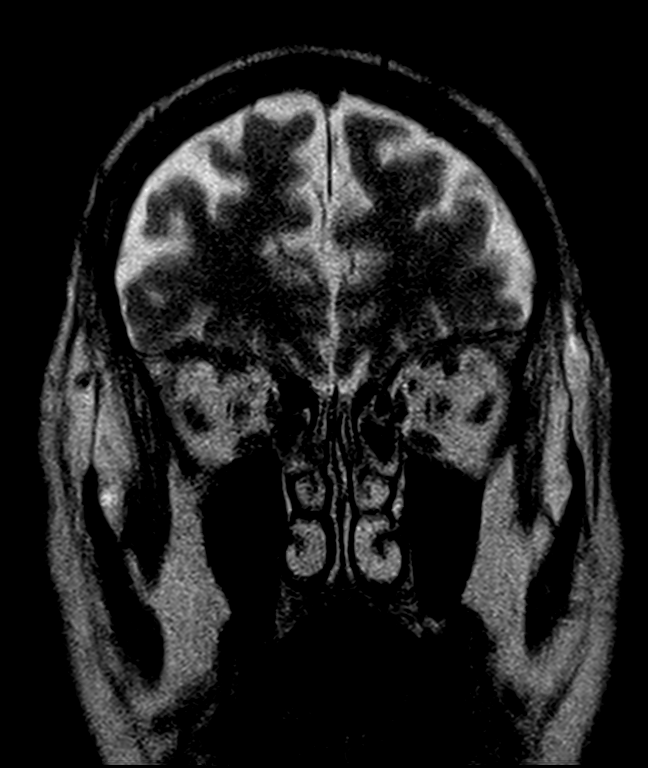

[Series 13: FLAIR · coronal · 3.0mm · 0.70mm/px · 2 of 28 slices shown (2 of 2)]
[im 1/28]
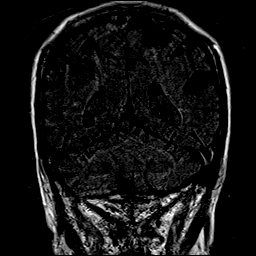
[im 28/28]
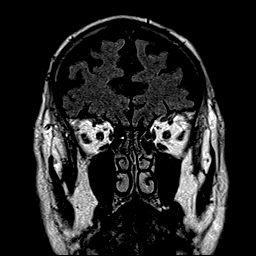

[Series 14: T2 · coronal · 5.0mm · 0.45mm/px · 2 of 25 slices shown (3 of 3)]
[im 1/25]
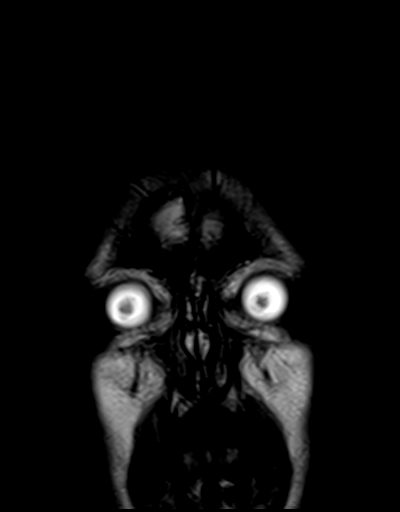
[im 25/25]
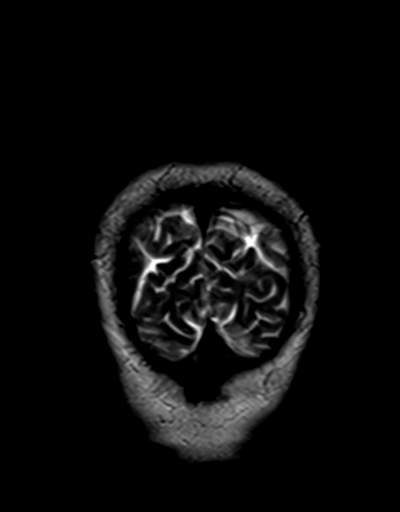

[Series 15: t1_mpr_tra · axial · 1.0mm · 0.75mm/px · z∈[-67,+76]mm · 8 of 144 slices shown (2 of 2)]
[im 1/144]
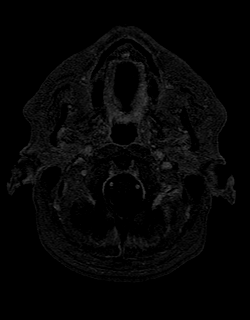
[im 18/144]
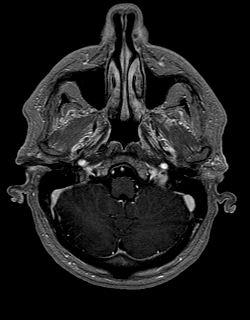
[im 36/144]
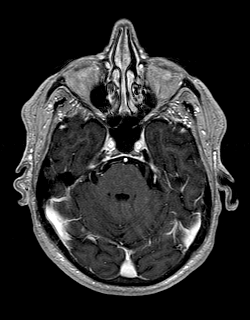
[im 54/144]
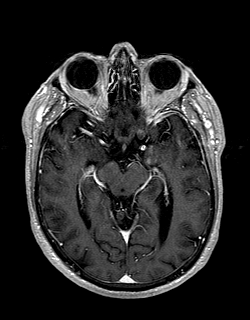
[im 90/144]
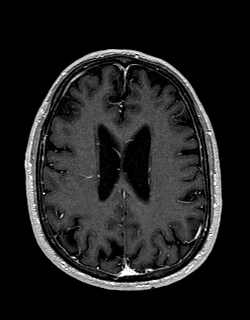
[im 108/144]
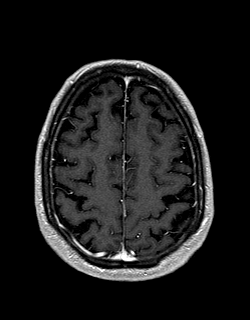
[im 126/144]
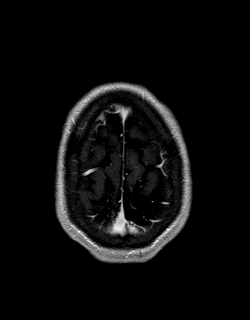
[im 144/144]
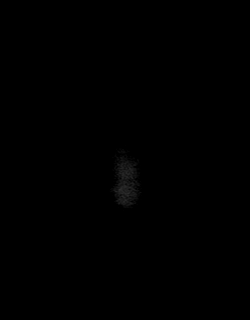

[Series 16: post cor · coronal · 5.0mm · 0.45mm/px · 2 of 25 slices shown]
[im 1/25]
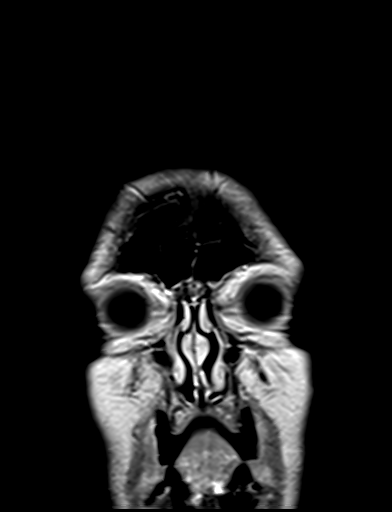
[im 25/25]
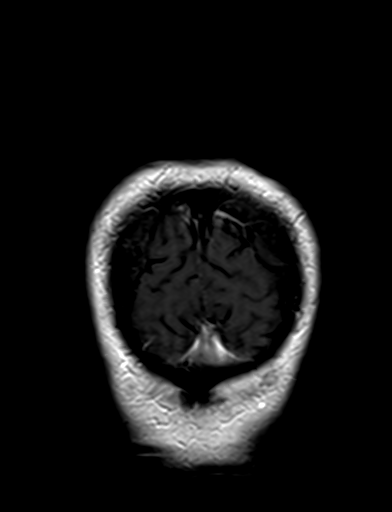

[47 of 48 positions shown; findings below may reference images not displayed]

FINDINGS: The study is partially degraded by motion.

Brain: No acute infarction, hemorrhage, hydrocephalus or extra-axial
collection.

Subtle increased T2 signal within the left amygdala with patchy
contrast enhancement is noted of the left amygdala with associated
decreased volume of the ipsilateral hippocampus. No edema or mass
effect.

Remote lacunar infarct in the right cerebellar hemisphere scattered
foci of T2 hyperintensity are seen within the white matter of the
cerebral hemispheres, nonspecific, most likely related to chronic
small vessel ischemia. Mild diffuse parenchymal volume loss.

Vascular: Normal flow voids.

Skull and upper cervical spine: Normal marrow signal.

Sinuses/Orbits: Negative.
IMPRESSION: 1. Subtle increased T2 signal within the left amygdala with patchy
contrast enhancement. No mass effect. Findings may be related to
autoimmune encephalitis, infectious encephalitis, recent seizure
activity and low-grade glioma. Associated volume loss of the
ipsilateral hippocampus favors longstanding process. Short interval
follow-up MRI with and without contrast is recommended.
2. Remote lacunar infarct in the right cerebellar hemisphere.
3. Mild chronic small vessel ischemia and volume loss.

These results will be called to the ordering clinician or
representative by the Radiologist Assistant, and communication
documented in the PACS or [REDACTED].

## 2020-03-24 MED ORDER — GADOBENATE DIMEGLUMINE 529 MG/ML IV SOLN
15.0000 mL | Freq: Once | INTRAVENOUS | Status: AC | PRN
  Administered 2020-03-24: 15 mL via INTRAVENOUS

## 2020-03-24 NOTE — Telephone Encounter (Signed)
Dr. Aquino's patient. 

## 2020-03-24 NOTE — Telephone Encounter (Signed)
Please see MRI report per [facility].

## 2020-03-30 MED ORDER — LEVETIRACETAM 1000 MG PO TABS: 1000.0000 mg | ORAL_TABLET | Freq: Two times a day (BID) | ORAL | 11 refills | Status: DC

## 2020-03-30 NOTE — Addendum Note (Signed)
Addended by: Van Clines on: 03/30/2020 03:31 PM   Modules accepted: Orders

## 2020-03-30 NOTE — Telephone Encounter (Signed)
Spoke to patient. He continues to have focal seizures and feels they are happening more. During the telephone conversation, his voice started to go hoarse, which is typical of the seizures. Able to converse without issues. Discussed MRI brain findings showing   Subtle increased T2 signal within the left amygdala with patchy contrast enhancement. No mass effect. Findings may be related to autoimmune encephalitis, infectious encephalitis, recent seizure activity and low-grade glioma. Associated volume loss of the ipsilateral hippocampus favors longstanding process. Short interval follow-up MRI with and without contrast is recommended  Advised to increase Levetiracetam to 750mg  BID to finish his current bottle, then increase to 1000mg : Take 1 tab BID. Rx sent. He will be scheduled for interval f/u MRI for May 2022. F/u as scheduled with me in March, he knows to call for any changes.

## 2020-04-28 ENCOUNTER — Telehealth: Payer: Self-pay | Admitting: Neurology

## 2020-04-28 NOTE — Telephone Encounter (Signed)
Patient's wife Mark English called and requested a call back from a nurse. She said the patient is frustrated about getting some answers about his seizures and his nervous condition.

## 2020-04-28 NOTE — Telephone Encounter (Signed)
Line busy at [phone].

## 2020-04-29 MED ORDER — OXCARBAZEPINE 300 MG PO TABS: ORAL_TABLET | ORAL | 6 refills | Status: DC

## 2020-04-29 NOTE — Telephone Encounter (Signed)
Spoke to patient. Even before the medication, he would be sitting and would just start crying. At [employer], voice starts to break. He keeps having seizures even on current Keppra 1000mg  BID. He is going over the edge with mood changes,worse since Keppra started. Instructed to reduce Keppra to 500mg  BID for 1 week, then 500mg  daily for a week, then stop. We will start oxcarbazepine 300mg : Take 1 tab BID for 1 week, then 2 tabs BID. F/u as scheduled with me next month.

## 2020-04-29 NOTE — Telephone Encounter (Signed)
Pt wife stated that " he is going NUTS cant sleep very angry and upset. Stated that he is still having symptoms." pt wife also stated that she does not think her husband can wait until this 05/23/20 appointment asking for something soon, pt wife informed that Dr Karel Jarvis schedule is full but we will ask and see if anything is open. Pt wife advised that I will call her back after I hear from Dr Karel Jarvis.

## 2020-05-03 ENCOUNTER — Ambulatory Visit: Payer: Medicare Other | Admitting: Neurology

## 2020-05-23 ENCOUNTER — Encounter: Payer: Self-pay | Admitting: Neurology

## 2020-05-23 ENCOUNTER — Ambulatory Visit (INDEPENDENT_AMBULATORY_CARE_PROVIDER_SITE_OTHER): Payer: Medicare Other | Admitting: Neurology

## 2020-05-23 ENCOUNTER — Other Ambulatory Visit: Payer: Self-pay

## 2020-05-23 VITALS — BP 152/88 | HR 97 | Ht 70.0 in | Wt 173.4 lb

## 2020-05-23 DIAGNOSIS — R569 Unspecified convulsions: Secondary | ICD-10-CM | POA: Diagnosis not present

## 2020-05-23 DIAGNOSIS — G40109 Localization-related (focal) (partial) symptomatic epilepsy and epileptic syndromes with simple partial seizures, not intractable, without status epilepticus: Secondary | ICD-10-CM | POA: Diagnosis not present

## 2020-05-23 NOTE — Patient Instructions (Signed)
1. Please start taking the Oxcarbazepine 300mg : take 1 tab twice a day for 1 week, then increase to 2 tablets twice a day  2. Let's get you off the Keppra (Levetiracetam) 500mg : Take 1 tablet every night for 1 week, then stop Keppra  3. Follow-up in 3 months, call for any changes   Seizure Precautions: 1. If medication has been prescribed for you to prevent seizures, take it exactly as directed.  Do not stop taking the medicine without talking to your doctor first, even if you have not had a seizure in a long time.   2. Avoid activities in which a seizure would cause danger to yourself or to others.  Don't operate dangerous machinery, swim alone, or climb in high or dangerous places, such as on ladders, roofs, or girders.  Do not drive unless your doctor says you may.  3. If you have any warning that you may have a seizure, lay down in a safe place where you can't hurt yourself.    4.  No driving for 6 months from last seizure, as per [facility].   Please refer to the following link on the [employer]ile[employer]'s website for more information: http://www.epilepsyfoundation.org/answerplace/Social/driving/drivingu.cfm   5.  Maintain good sleep hygiene. Avoid alcohol.  6.  Contact your doctor if you have any problems that may be related to the medicine you are taking.  7.  Call 911 and bring the patient back to the ED if:        A.  The seizure lasts longer than 5 minutes.       B.  The patient doesn't awaken shortly after the seizure  C.  The patient has new problems such as difficulty seeing, speaking or moving  D.  The patient was injured during the seizure  E.  The patient has a temperature over 102 F (39C)  F.  The patient vomited and now is having trouble breathing

## 2020-05-23 NOTE — Progress Notes (Signed)
NEUROLOGY FOLLOW UP OFFICE NOTE  [name] [id number] 07-21-1950  HISTORY OF PRESENT ILLNESS: I had the pleasure of seeing Mark English in follow-up in the neurology clinic on 05/23/2020.  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 He was started on Levetiracetam in November 2021. I personally reviewed MRI brain with and without contrast done 03/2020 which did not show any acute changes. There was subtle increased T2 signal in the left amygdala with patchy contrast enhancement and decreased volume of ipsilateral hippocampus. Short interval MRI with and without contrast was recommended. He called our office very upset and angry, with increased mood changes likely due to Levetiracetam. He was instructed to wean off LEV and start oxcarbazepine, however today reports that he did not start the oxcarbazepine and continues on LEV. He continues to report serious mood swings, he has a hard time controlling it especially in the mornings. He states he is on the verge of crying like a baby. 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 He continues to report focal seizures in his right hand. He denies any headaches, dizziness, no falls.  He is concerned that he is on too much medications.   History on Initial Assessment 01/27/2020: This is a 70 year old left-handed man with a history of hypertension, diabetes, AV block, cognitive impairment, chronic PTSD, presenting for evaluation of focal seizures. He states symptoms have been going on for years, but became more noticeable in the past year or so. He describes episodes starting with a sudden "real serious sense of dread." His voice would change, it sounds like he is almost ready to cry, like he is straining to speak, then almost immediately his right arm becomes hard to control. If he is holding a drink, it would spill. His hand  is like a claw, then goes in a fist. He can voluntarily flex at the elbow and hold it his arm to his shoulder until the episode subsides in a couple of minutes. There is no associated loss of awareness, his wife denies any staring/unresponsive episodes. No focal jerking, numbness/tingling/post-event weakness. They can occur multiple times a day, longest seizure-free interval of 1-2 days. He has noticed that when he is playing computer games, he can have 3-4 episodes within 10 minutes. His wife has not noticed any nocturnal episodes. Face and leg are not affected. He denies any gaps in time, olfactory/gustatory hallucinations, myoclonic jerks. He has rare episodes of deja vu. He denies any headaches, dizziness, diplopia, dysarthria/dysphagia, neck/back pain, bowel/bladder dysfunction. He had an EEG done 01/27/20 which captured a focal electrographic seizure arising from the left temporal region. He started Levetiracetam 500mg  BID 2 weeks ago and feels there "may be" a little reduction with the seizures, they are not as often as they used to be. He denies any side effects however his wife notes he is " ."   He has had memory issues since his last 40s/early 22s. His wife started noticing changes 3-4 years ago. He forgets conversations. His wife reports he puts dishes in the wrong place despite repeated instructions. He forgets things they did or places they went. He denies getting lost driving. He occasionally forgets his medications. His wife manages finances. His parents have memory problems. He denies any alcohol use. 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 His wife is concerned he was exposed to an extremely small amount of [place] gas in the [place] war in 1991. 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 His wife reports he is extremely restless when he sleeps, talks in his sleep. He has a diagnosis of  PTSD. He is on Donepezil and Memantine.   Epilepsy Risk Factors:  He had a normal birth and early development.  There is no history of febrile convulsions, CNS infections such as meningitis/encephalitis, significant traumatic brain injury, neurosurgical procedures, or family history of seizures.  PAST MEDICAL HISTORY: Past Medical History:  Diagnosis Date  . Atrioventricular block, Mobitz type 2   . Diabetes mellitus without complication (HCC)   . Disease of intestine   . Hypertension   . Irregular heart beat   . Type 2 diabetes mellitus with hyperglycemia (HCC)   . Unspecified urinary incontinence     MEDICATIONS: Current Outpatient Medications on File Prior to Visit  Medication Sig Dispense Refill  . donepezil (ARICEPT) 5 MG tablet Take 5 mg by mouth at bedtime.    Marland Kitchen Ertugliflozin L-PyroglutamicAc (STEGLATRO) 5 MG TABS Take 1 tablet by mouth daily.    . famotidine (PEPCID) 40 MG tablet Take 40 mg by mouth daily.    . hydrOXYzine (ATARAX/VISTARIL) 10 MG tablet Take 10 mg by mouth every 8 (eight) hours as needed for anxiety.    . insulin lispro (HUMALOG) 100 UNIT/ML injection Inject into the skin in the morning and at bedtime.    Marland Kitchen losartan (COZAAR) 50 MG tablet Take 50 mg by mouth 2 (two) times daily.    . memantine (NAMENDA) 5 MG tablet     . metoprolol succinate (TOPROL-XL) 25 MG 24 hr tablet Take 1 tablet (25 mg total) by mouth daily. 90 tablet 1  . Vitamin D, Ergocalciferol, (DRISDOL) 1.25 MG (50000 UNIT) CAPS capsule Take 50,000 Units by mouth every 7 (seven) days.    . Oxcarbazepine (TRILEPTAL) 300 MG tablet Take 1 tablet twice a day for 1 week, then increase to 2 tablets twice a day (Patient not taking: Reported on 05/23/2020) 120 tablet 6   No current facility-administered medications on file prior to visit.    ALLERGIES: Allergies  Allergen Reactions  . Vicodin [Hydrocodone-Acetaminophen] Other (See Comments)    Blood pressure dropped    FAMILY HISTORY: Family  History  Problem Relation Age of Onset  . Diabetes Mother   . Heart disease Father     SOCIAL HISTORY: Social History   Socioeconomic History  . Marital status: Married    Spouse name: Not on file  . Number of children: 5  . Years of education: Not on file  . Highest education level: Not on file  Occupational History  . Occupation: retired Office manager  Tobacco Use  . Smoking status: Never Smoker  . Smokeless tobacco: Never Used  Vaping Use  . Vaping Use: Never used  Substance and Sexual Activity  . Alcohol use: Not Currently  . Drug use: No  . Sexual activity: Not on file  Other Topics Concern  . Not on file  Social History Narrative   Left handed    Lives with wife   Social Determinants of Health   Financial Resource Strain: Not on file  Food Insecurity: Not on file  Transportation Needs: Not on file  Physical Activity: Not on file  Stress: Not on file  Social Connections: Not on file  Intimate Partner  Violence: Not on file     PHYSICAL EXAM: Vitals:   05/23/20 1426  BP: (!) 152/88  Pulse: 97  SpO2: (!) 83%   General: No acute distress Head:  Normocephalic/atraumatic Skin/Extremities: No rash, no edema Neurological Exam: alert and awake. No aphasia or dysarthria. Fund of knowledge is appropriate.   Attention and concentration are normal.   Cranial nerves: Pupils equal, round. Extraocular movements intact.  No facial asymmetry.  Motor: moves all extremities symmetrically. Gait narrow-based, no ataxia.   IMPRESSION: This is a 70 yo LH man with a history of hypertension, diabetes, AV block, cognitive impairment, chronic PTSD, with left temporal lobe epilepsy. MRI brain showed increased signal in the left amygdala with patchy contrast enhancement, likely related to recent seizure activity, although differentials include low grade glioma, autoimmune encephalitis, and infectious encephalitis. The volume loss in the left hippocampus favors a longstanding process. EEG  captured an electrographic seizure arising from the left temporal region. He is having significant mood changes with Levetiracetam, we had an extensive discussion today about the need for seizure medication. He did not start the oxcarbazepine and after discussion and addressing concerns, he agrees to wean off LEV and start oxcarbazepine 300mg  BID for 1 week, then increase to 600mg  BID, side effects discussed. He is aware of [place] driving laws to stop driving after an episode of loss of consciousness until 6 months seizure-free. Follow-up in 3 months, call for any changes.     Thank you for allowing me to participate in his care.  Please do not hesitate to call for any questions or concerns.   , M.D.   CC: Dr. 

## 2020-07-01 ENCOUNTER — Emergency Department (HOSPITAL_COMMUNITY)
Admission: EM | Admit: 2020-07-01 | Discharge: 2020-07-01 | Disposition: A | Discharge status: Home / Self Care | Payer: Medicare Other | Attending: Emergency Medicine | Admitting: Emergency Medicine

## 2020-07-01 ENCOUNTER — Emergency Department (HOSPITAL_COMMUNITY): Payer: Medicare Other

## 2020-07-01 ENCOUNTER — Other Ambulatory Visit: Payer: Self-pay

## 2020-07-01 DIAGNOSIS — R55 Syncope and collapse: Secondary | ICD-10-CM

## 2020-07-01 DIAGNOSIS — I517 Cardiomegaly: Secondary | ICD-10-CM | POA: Insufficient documentation

## 2020-07-01 DIAGNOSIS — E860 Dehydration: Secondary | ICD-10-CM | POA: Diagnosis not present

## 2020-07-01 DIAGNOSIS — I10 Essential (primary) hypertension: Secondary | ICD-10-CM | POA: Diagnosis not present

## 2020-07-01 DIAGNOSIS — E119 Type 2 diabetes mellitus without complications: Secondary | ICD-10-CM | POA: Insufficient documentation

## 2020-07-01 DIAGNOSIS — Z794 Long term (current) use of insulin: Secondary | ICD-10-CM | POA: Insufficient documentation

## 2020-07-01 DIAGNOSIS — Z79899 Other long term (current) drug therapy: Secondary | ICD-10-CM | POA: Diagnosis not present

## 2020-07-01 DIAGNOSIS — R42 Dizziness and giddiness: Secondary | ICD-10-CM

## 2020-07-01 LAB — CBC WITH DIFFERENTIAL/PLATELET
Abs Immature Granulocytes: 0.03 10*3/uL (ref 0.00–0.07)
Basophils Absolute: 0.1 10*3/uL (ref 0.0–0.1)
Basophils Relative: 1 %
Eosinophils Absolute: 0.1 10*3/uL (ref 0.0–0.5)
Eosinophils Relative: 1 %
HCT: 46.6 % (ref 39.0–52.0)
Hemoglobin: 15.2 g/dL (ref 13.0–17.0)
Immature Granulocytes: 0 %
Lymphocytes Relative: 8 %
Lymphs Abs: 0.9 10*3/uL (ref 0.7–4.0)
MCH: 28.3 pg (ref 26.0–34.0)
MCHC: 32.6 g/dL (ref 30.0–36.0)
MCV: 86.8 fL (ref 80.0–100.0)
Monocytes Absolute: 0.9 10*3/uL (ref 0.1–1.0)
Monocytes Relative: 9 %
Neutro Abs: 8.4 10*3/uL — ABNORMAL HIGH (ref 1.7–7.7)
Neutrophils Relative %: 81 %
Platelets: 249 10*3/uL (ref 150–400)
RBC: 5.37 MIL/uL (ref 4.22–5.81)
RDW: 12.8 % (ref 11.5–15.5)
WBC: 10.5 10*3/uL (ref 4.0–10.5)
nRBC: 0 % (ref 0.0–0.2)

## 2020-07-01 LAB — COMPREHENSIVE METABOLIC PANEL
ALT: 17 U/L (ref 0–44)
AST: 15 U/L (ref 15–41)
Albumin: 3.4 g/dL — ABNORMAL LOW (ref 3.5–5.0)
Alkaline Phosphatase: 73 U/L (ref 38–126)
Anion gap: 7 (ref 5–15)
BUN: 12 mg/dL (ref 8–23)
CO2: 26 mmol/L (ref 22–32)
Calcium: 8.7 mg/dL — ABNORMAL LOW (ref 8.9–10.3)
Chloride: 101 mmol/L (ref 98–111)
Creatinine, Ser: 1.07 mg/dL (ref 0.61–1.24)
GFR, Estimated: >60 mL/min (ref >60)
Glucose, Bld: 204 mg/dL — ABNORMAL HIGH (ref 70–99)
Potassium: 4.2 mmol/L (ref 3.5–5.1)
Sodium: 134 mmol/L — ABNORMAL LOW (ref 135–145)
Total Bilirubin: 1 mg/dL (ref 0.3–1.2)
Total Protein: 6.6 g/dL (ref 6.5–8.1)

## 2020-07-01 LAB — TROPONIN I (HIGH SENSITIVITY)
Troponin I (High Sensitivity): 12 ng/L (ref <18)
Troponin I (High Sensitivity): 13 ng/L (ref <18)

## 2020-07-01 IMAGING — DX DG CHEST 1V PORT
1 series · 1 of 1 positions shown · non-contrast
Comparison: None.

CLINICAL DATA: Syncope, dizziness, weakness and hypotension

EXAM:
PORTABLE CHEST 1 VIEW

[chest ap]
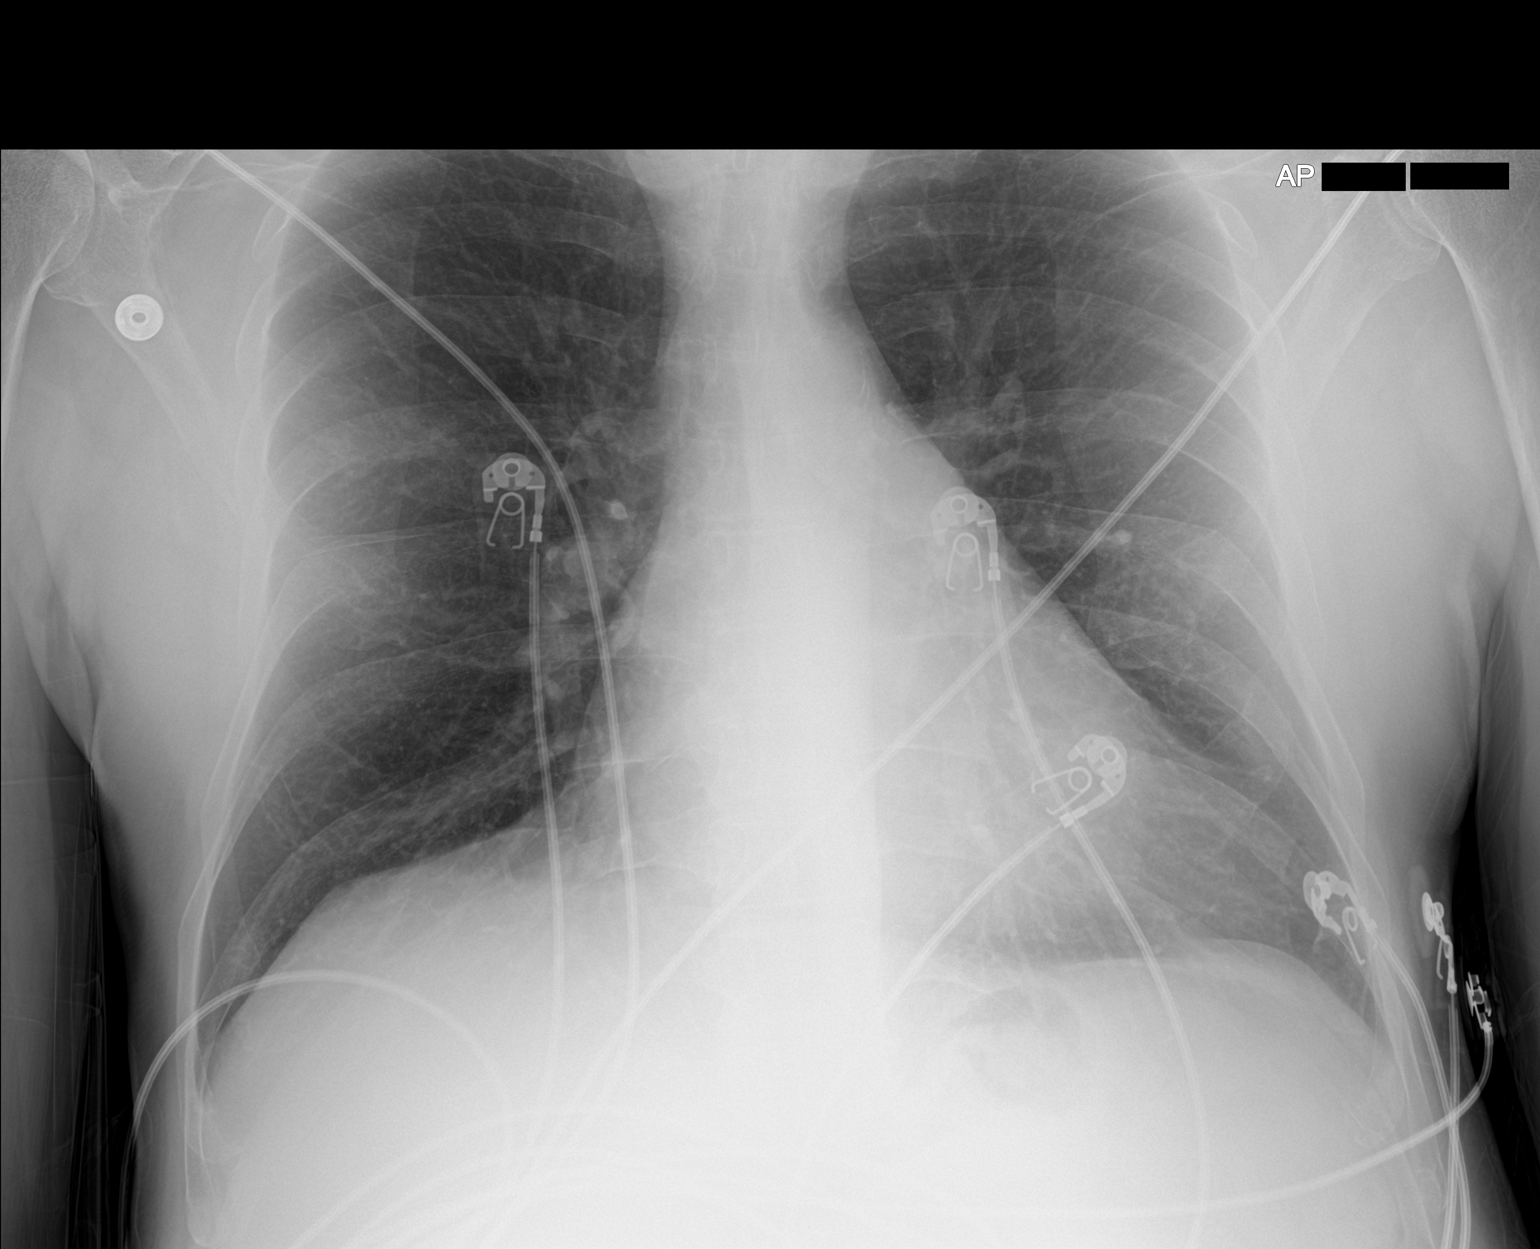

[1 of 1 positions shown; findings below may reference images not displayed]

FINDINGS: The heart size and mediastinal contours are within normal limits.
Both lungs are clear. The visualized skeletal structures are
unremarkable.
IMPRESSION: No active disease.

## 2020-07-01 MED ORDER — LACTATED RINGERS IV BOLUS
500.0000 mL | Freq: Once | INTRAVENOUS | Status: AC
  Administered 2020-07-01: 500 mL via INTRAVENOUS

## 2020-07-01 MED ORDER — ONDANSETRON HCL 4 MG PO TABS: 4.0000 mg | ORAL_TABLET | Freq: Four times a day (QID) | ORAL | 0 refills | Status: DC

## 2020-07-01 NOTE — ED Provider Notes (Signed)
[facility] EMERGENCY DEPARTMENT Provider Note   CSN: [id number] Arrival date & time: 07/01/20  1148     History Chief Complaint  Patient presents with  . Hypotension  . Dizziness    BRISTON LAX is a 70 y.o. male.  Patient is a 70 year old male with a history of hypertension, diabetes, Mobitz type II block who is presenting today with near syncope.  Patient reports for the last 2 to 3 days he has had nausea and decreased oral intake.  He has not had any vomiting and has had decreased frequency of stool but no diarrhea or constipation.  He does have issues with some urinary frequency that come and go but nothing beyond his baseline.  Today he went to an event that he had to attend at a drugstore and as he was walking and he started to feel very hot and the lights were very bright and he started to feel dizzy and weak.  He had to sit down and they started checking his blood pressure and he states initial pressure was 107/50 which he states is very low for him.  When EMS arrived his systolic blood pressure was in the 90s.  He denied any chest pain, palpitations or shortness of breath during or after this event.  He states currently he is back to baseline and feels fine.  He did receive 500 mL of fluid in route and recurrent blood pressure was 140 systolic.  Patient's blood sugar was 220.  He denies any recent fever, cough or URI symptoms.  He has had no recent change in medications.  Patient does take multiple blood pressure medications and did take his medication this morning.  The history is provided by the patient.  Dizziness      Past Medical History:  Diagnosis Date  . Atrioventricular block, Mobitz type 2   . Diabetes mellitus without complication (HCC)   . Disease of intestine   . Hypertension   . Irregular heart beat   . Type 2 diabetes mellitus with hyperglycemia (HCC)   . Unspecified urinary incontinence     There are no problems to display for this  patient.   Past Surgical History:  Procedure Laterality Date  . 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 Diabetes Mother   . Heart disease Father     Social History   Tobacco Use  . Smoking status: Never Smoker  . Smokeless tobacco: Never Used  Vaping Use  . Vaping Use: Never used  Substance Use Topics  . Alcohol use: Not Currently  . Drug use: No    Home Medications Prior to Admission medications   Medication Sig Start Date End Date Taking? Authorizing Provider  donepezil (ARICEPT) 5 MG tablet Take 5 mg by mouth at bedtime.    [provider]  Ertugliflozin L-PyroglutamicAc (STEGLATRO) 5 MG TABS Take 1 tablet by mouth daily.    [provider]  famotidine (PEPCID) 40 MG tablet Take 40 mg by mouth daily.    [provider]  hydrOXYzine (ATARAX/VISTARIL) 10 MG tablet Take 10 mg by mouth every 8 (eight) hours as needed for anxiety.    [provider]  insulin lispro (HUMALOG) 100 UNIT/ML injection Inject into the skin in the morning and at bedtime.    [provider]  losartan (COZAAR) 50 MG tablet Take 50 mg by  mouth 2 (two) times daily.    [provider]  memantine [facility]) 5 MG tablet  11/05/19   [provider]  metoprolol succinate (TOPROL-XL) 25 MG 24 hr tablet Take 1 tablet (25 mg total) by mouth daily. 09/08/19   O'Neal, Ronnald Ramp, MD  Oxcarbazepine (TRILEPTAL) 300 MG tablet Take 1 tablet twice a day for 1 week, then increase to 2 tablets twice a day Patient not taking: Reported on 05/23/2020 04/29/20   Van Clines, MD  Vitamin D, Ergocalciferol, (DRISDOL) 1.25 MG (50000 UNIT) CAPS capsule Take 50,000 Units by mouth every 7 (seven) days.    [provider]    Allergies    Vicodin [hydrocodone-acetaminophen]  Review of Systems   Review of Systems  Neurological: Positive for dizziness.  All other systems reviewed  and are negative.   Physical Exam Updated Vital Signs BP (!) 134/50   Pulse 64   Temp (!) 97.5 F (36.4 C) (Oral)   Resp 15   SpO2 100%   Physical Exam Vitals and nursing note reviewed.  Constitutional:      General: He is not in acute distress.    Appearance: He is well-developed.  HENT:     Head: Normocephalic and atraumatic.     Mouth/Throat:     Mouth: Mucous membranes are moist.  Eyes:     Conjunctiva/sclera: Conjunctivae normal.     Pupils: Pupils are equal, round, and reactive to light.  Cardiovascular:     Rate and Rhythm: Normal rate and regular rhythm.     Heart sounds: No murmur heard.   Pulmonary:     Effort: Pulmonary effort is normal. No respiratory distress.     Breath sounds: Normal breath sounds. No wheezing or rales.  Abdominal:     General: Bowel sounds are normal. There is no distension.     Palpations: Abdomen is soft.     Tenderness: There is no abdominal tenderness. There is no guarding or rebound.  Musculoskeletal:        General: No tenderness. Normal range of motion.     Cervical back: Normal range of motion and neck supple.     Right lower leg: No edema.     Left lower leg: No edema.  Skin:    General: Skin is warm and dry.     Findings: No erythema or rash.  Neurological:     Mental Status: He is alert and oriented to person, place, and time.     Comments: Some difficulty recalling more recent events and trouble recalling his particular medical diagnoses  Psychiatric:        Behavior: Behavior normal.     ED Results / Procedures / Treatments   Labs (all labs ordered are listed, but only abnormal results are displayed) Labs Reviewed  CBC WITH DIFFERENTIAL/PLATELET - Abnormal; Notable for the following components:      Result Value   Neutro Abs 8.4 (*)    All other components within normal limits  COMPREHENSIVE METABOLIC PANEL - Abnormal; Notable for the following components:   Sodium 134 (*)    Glucose, Bld 204 (*)    Calcium  8.7 (*)    Albumin 3.4 (*)    All other components within normal limits  URINALYSIS, ROUTINE W REFLEX MICROSCOPIC  TROPONIN I (HIGH SENSITIVITY)  TROPONIN I (HIGH SENSITIVITY)    EKG EKG Interpretation  Date/Time:  Friday July 01 2020 12:04:25 EDT Ventricular Rate:  63 PR Interval:  125 QRS Duration:  108 QT Interval:  428 QTC Calculation: 439 R Axis:   -45 Text Interpretation: Sinus rhythm LAD, consider left anterior fascicular block LVH with secondary repolarization abnormality Anterior ST elevation, probably due to LVH No previous tracing Confirmed by Gwyneth Sprout ([id number]) on 07/01/2020 12:08:59 PM   Radiology DG Chest Port 1 View  Result Date: 07/01/2020 CLINICAL DATA:  Syncope, dizziness, weakness and hypotension EXAM: PORTABLE CHEST 1 VIEW COMPARISON:  None. FINDINGS: The heart size and mediastinal contours are within normal limits. Both lungs are clear. The visualized skeletal structures are unremarkable. IMPRESSION: No active disease. Electronically Signed   By: Judie Petit.  Shick M.D.   On: 07/01/2020 12:34    Procedures Procedures   Medications Ordered in ED Medications  lactated ringers bolus 500 mL (has no administration in time range)    ED Course  I have reviewed the triage vital signs and the nursing notes.  Pertinent labs & imaging results that were available during my care of the patient were reviewed by me and considered in my medical decision making (see chart for details).    MDM Rules/Calculators/A&P                          Patient presenting today with a near syncopal event.  He does report nausea for the last few days and decreased oral intake.  His symptoms today sounded orthostatic in nature as it was worse when he stood up and he felt hot and woozy which improved with sitting down.  He was hypotensive when EMS arrived and resolved after IV fluids.  He does take blood pressure medications as well as insulin and had taken those this morning but had not  eaten.  He denied any chest pain or shortness of breath.  Patient's EKG does show evidence of LVH but no dysrhythmia and no old to compare.  Low suspicion for infectious etiology as he has no abdominal pain, cough or shortness of breath.  He is satting 100% on room air.  Low suspicion for pneumonia, PE or dissection.  However cannot rule out atypical ACS with patient's nonspecific nausea.  Patient currently has no complaints except minimal nausea.  Labs are pending.  We will continue to monitor.  2:39 PM Patient's CBC is within normal limits, initial troponin is 12, CMP with blood sugar of 204 and creatinine of 1.07 with a normal anion gap.  Chest x-ray without acute findings.  On reevaluation patient reports the nausea is almost completely gone after the Zofran.  Delta troponin is pending.  Patient given something to eat.  He feels that he is at baseline right now and denies any symptoms of being lightheaded.  We will need to ambulate patient but feel that he will most likely be able to to go home if symptoms continue to be resolved and second troponin is normal.  3:19 PM Pt ambulated without difficulty and appears stable for d/c with return precaution for any worsening symptoms.  Pt given zofran to use for his nausea.   Eating and drinking here without issue.  MDM Number of Diagnoses or Management Options   Amount and/or Complexity of Data Reviewed Clinical lab tests: ordered and reviewed Tests in the radiology section of CPT: ordered and reviewed Tests in the medicine section of CPT: ordered and reviewed Decide to obtain previous medical records or to obtain history from someone other than the patient: yes Obtain history from someone other than the patient: no Review and  summarize past medical records: yes Discuss the patient with other providers: no Independent visualization of images, tracings, or specimens: yes  Patient Progress Patient progress: improved   Final Clinical  Impression(s) / ED Diagnoses Final diagnoses:  Dehydration  Near syncope  Orthostatic dizziness    Rx / DC Orders ED Discharge Orders         Ordered    ondansetron (ZOFRAN) 4 MG tablet  Every 6 hours        07/01/20 1519           Gwyneth SproutPlunkett, Cheryl Stabenow, MD 07/01/20 1521

## 2020-07-01 NOTE — ED Triage Notes (Signed)
PT BIB EMS for c/o episodes of dizziness, weakness and hypotension. Also reports decreased appetite related to nausea for the last couple days. BGL 212.

## 2020-07-01 NOTE — Discharge Instructions (Signed)
It appears you were dehydrated today.  With not eating and taking your blood pressure medication that most likely major blood pressure drop.  Since fluids her blood pressure has been normal.  Make sure you are eating and drinking to avoid this in the future.  Return to the emergency room if you have any further episodes.  Or if you develop chest pain, shortness of breath or palpitations.

## 2020-07-18 ENCOUNTER — Telehealth: Payer: Self-pay | Admitting: Endocrinology

## 2020-07-18 NOTE — Telephone Encounter (Signed)
[facility] called to see if we could send over the most recent office notes for patient to fax # [phone].

## 2020-07-18 NOTE — Telephone Encounter (Signed)
Faxed paper over to let them know he is not a patient here.

## 2020-09-02 ENCOUNTER — Other Ambulatory Visit: Payer: Self-pay

## 2020-09-02 ENCOUNTER — Encounter: Payer: Self-pay | Admitting: Neurology

## 2020-09-02 ENCOUNTER — Ambulatory Visit (INDEPENDENT_AMBULATORY_CARE_PROVIDER_SITE_OTHER): Payer: Medicare Other | Admitting: Neurology

## 2020-09-02 VITALS — BP 154/71 | HR 64 | Ht 70.0 in | Wt 173.2 lb

## 2020-09-02 DIAGNOSIS — G40109 Localization-related (focal) (partial) symptomatic epilepsy and epileptic syndromes with simple partial seizures, not intractable, without status epilepticus: Secondary | ICD-10-CM

## 2020-09-02 MED ORDER — OXCARBAZEPINE 600 MG PO TABS: 600.0000 mg | ORAL_TABLET | Freq: Two times a day (BID) | ORAL | 3 refills | Status: DC

## 2020-09-02 NOTE — Progress Notes (Signed)
NEUROLOGY FOLLOW UP OFFICE NOTE  Mark English [id number] 1950/05/28  HISTORY OF PRESENT ILLNESS: I had the pleasure of seeing Mark English in follow-up in the neurology clinic on 09/02/2020.  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 On his last visit, he reported significant mood swings with Levetiracetam and was switched to oxcarbazepine, currently on 600mg  BID. He denies any further focal motor seizures affecting the right arm. He is still having issues with emotional control, but not as many and not as bad. They mostly depend on the circumstances, but something minor may trigger them. The spontaneous ones are not as near what they used to be. When he notices the cracking in his voice, he gets emotional and then would get mad and upset, really bothering him a lot. He notices he is a little more irritable when he forgets to take his morning dose, but he is "operating a little more back to normal than before." He denies any staring/unresponsive episodes, gaps in time, focal numbness/tingling/weakness. Sleeping is a little better. For the past 3-4 months, he has been having itchy rashes on the forearms, thighs, calves, lower abdomen and a little in the back. He has right knee and thigh pain.    History on Initial Assessment 01/27/2020: This is a 70 year old left-handed man with a history of hypertension, diabetes, AV block, cognitive impairment, chronic PTSD, presenting for evaluation of focal seizures. He states symptoms have been going on for years, but became more noticeable in the past year or so. He describes episodes starting with a sudden "real serious sense of dread." His voice would change, it sounds like he is almost ready to cry, like he is straining to speak, then almost immediately his right arm becomes hard to control. If he is holding a drink, it would spill. His hand is like a claw, then goes in a fist. He can voluntarily flex at the elbow and hold it his  arm to his shoulder until the episode subsides in a couple of minutes. There is no associated loss of awareness, his wife denies any staring/unresponsive episodes. No focal jerking, numbness/tingling/post-event weakness. They can occur multiple times a day, longest seizure-free interval of 1-2 days. He has noticed that when he is playing computer games, he can have 3-4 episodes within 10 minutes. His wife has not noticed any nocturnal episodes. Face and leg are not affected. He denies any gaps in time, olfactory/gustatory hallucinations, myoclonic jerks. He has rare episodes of deja vu. He denies any headaches, dizziness, diplopia, dysarthria/dysphagia, neck/back pain, bowel/bladder dysfunction. He had an EEG done 01/27/20 which captured a focal electrographic seizure arising from the left temporal region. He started Levetiracetam 500mg  BID 2 weeks ago and feels there "may be" a little reduction with the seizures, they are not as often as they used to be. He denies any side effects however his wife notes he is "01/29/20."   He has had memory issues since his last 40s/early 65s. His wife started noticing changes 3-4 years ago. He forgets conversations. His wife reports he puts dishes in the wrong place despite repeated instructions. He forgets things they did or places they went. He denies getting lost driving. He occasionally forgets his medications. His wife manages finances. His parents have memory problems. He denies any alcohol use. 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 His wife is concerned he was exposed to an  extremely small amount of [place] gas in the [place] war in 1991. 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 His wife reports he is extremely restless when he sleeps, talks in his sleep. He has a diagnosis of PTSD. He is on Donepezil and Memantine.   Epilepsy Risk Factors:  He had a normal birth  and early development.  There is no history of febrile convulsions, CNS infections such as meningitis/encephalitis, significant traumatic brain injury, neurosurgical procedures, or family history of seizures.  Prior ASMs: Levetiracetam (mood swings)  Diagnostic Data: EEG done 01/27/20 which captured a focal electrographic seizure arising from the left temporal region MRI brain with and without contrast done 03/2020 showed subtle increased T2 signal within the left amygdala with patchy contrast enhancement. No mass effect. Findings may be related to autoimmune encephalitis, infectious encephalitis, recent seizure activity and low-grade glioma. Associated volume loss of the ipsilateral hippocampus favors longstanding process.  PAST MEDICAL HISTORY: Past Medical History:  Diagnosis Date   Atrioventricular block, Mobitz type 2    Diabetes mellitus without complication (HCC)    Disease of intestine    Hypertension    Irregular heart beat    Type 2 diabetes mellitus with hyperglycemia (HCC)    Unspecified urinary incontinence     MEDICATIONS: Current Outpatient Medications on File Prior to Visit  Medication Sig Dispense Refill   insulin lispro (HUMALOG) 100 UNIT/ML injection Inject into the skin in the morning and at bedtime.     losartan (COZAAR) 50 MG tablet Take 50 mg by mouth 2 (two) times daily.     Oxcarbazepine (TRILEPTAL) 300 MG tablet Take 1 tablet twice a day for 1 week, then increase to 2 tablets twice a day 120 tablet 6   Vitamin D, Ergocalciferol, (DRISDOL) 1.25 MG (50000 UNIT) CAPS capsule Take 50,000 Units by mouth every 7 (seven) days.     No current facility-administered medications on file prior to visit.    ALLERGIES: Allergies  Allergen Reactions   Vicodin [Hydrocodone-Acetaminophen] Other (See Comments)    Blood pressure dropped    FAMILY HISTORY: Family History  Problem Relation Age of Onset   Diabetes Mother    Heart disease Father     SOCIAL  HISTORY: Social History   Socioeconomic History   Marital status: Married    Spouse name: Not on file   Number of children: 5   Years of education: Not on file   Highest education level: Not on file  Occupational History   Occupation: retired Office manager  Tobacco Use   Smoking status: Never   Smokeless tobacco: Never  Vaping Use   Vaping Use: Never used  Substance and Sexual Activity   Alcohol use: Not Currently   Drug use: No   Sexual activity: Not on file  Other Topics Concern   Not on file  Social History Narrative   Left handed    Lives with wife   Social Determinants of Health   Financial Resource Strain: Not on file  Food Insecurity: Not on file  Transportation Needs: Not on file  Physical Activity: Not on file  Stress: Not on file  Social Connections: Not on file  Intimate Partner Violence: Not on file     PHYSICAL EXAM: Vitals:   09/02/20 1124  BP: (!) 154/71  Pulse: 64  SpO2: 98%   General: No acute distress Head:  Normocephalic/atraumatic Skin/Extremities: He has abrasions on the legs and arms from scratching, there  are several red papules on the forearms, legs, right lower abdomen. None on chest or back. Neurological Exam: alert and awake. No aphasia or dysarthria. Fund of knowledge is appropriate.  Recent and remote memory are intact.  Attention and concentration are normal.   Cranial nerves: Pupils equal, round. Extraocular movements intact with no nystagmus. Visual fields full.  No facial asymmetry.  Motor: Bulk and tone normal, muscle strength 5/5 throughout with no pronator drift.   Finger to nose testing intact.  Gait narrow-based and steady, able to tandem walk adequately.  Romberg negative.   IMPRESSION: This is a 70 yo LH man with a history of hypertension, diabetes, AV block, cognitive impairment, chronic PTSD, with left temporal lobe epilepsy. MRI brain showed increased signal in the left amygdala with patchy contrast enhancement, likely related  to recent seizure activity, although differentials include low grade glioma, autoimmune encephalitis, and infectious encephalitis. The volume loss in the left hippocampus favors a longstanding process. EEG captured an electrographic seizure arising from the left temporal region. No further focal motor seizures affecting his right arm with initiation of oxcarbazepine. Mood is better as well, he still has some emotional moments. We discussed option of increasing oxcarbazepine to 900mg  BID, but opted to stay on 600mg  BID for now. He knows to call for any changes. He is aware of [place] driving laws to stop driving after a seizure with loss of awareness until 6 months seizure-free. Discuss rash with PCP. Follow-up in 4 months, monitor sodium levels on follow-up, call for any changes.    Thank you for allowing me to participate in his care.  Please do not hesitate to call for any questions or concerns.   , M.D.   CC: Dr. 

## 2020-09-02 NOTE — Patient Instructions (Signed)
Good to see you!  We will switch to oxcarbazepine 600mg : Take 1 tablet twice a day  2. Follow-up in 4 months, call for any changes   Seizure Precautions: 1. If medication has been prescribed for you to prevent seizures, take it exactly as directed.  Do not stop taking the medicine without talking to your doctor first, even if you have not had a seizure in a long time.   2. Avoid activities in which a seizure would cause danger to yourself or to others.  Don't operate dangerous machinery, swim alone, or climb in high or dangerous places, such as on ladders, roofs, or girders.  Do not drive unless your doctor says you may.  3. If you have any warning that you may have a seizure, lay down in a safe place where you can't hurt yourself.    4.  No driving for 6 months from last seizure, as per [facility].   Please refer to the following link on the Epile[employer] Foundation of [employer]'s website for more information: http://www.epilepsyfoundation.org/answerplace/Social/driving/drivingu.cfm   5.  Maintain good sleep hygiene. Avoid alcohol.  6.  Contact your doctor if you have any problems that may be related to the medicine you are taking.  7.  Call 911 and bring the patient back to the ED if:        A.  The seizure lasts longer than 5 minutes.       B.  The patient doesn't awaken shortly after the seizure  C.  The patient has new problems such as difficulty seeing, speaking or moving  D.  The patient was injured during the seizure  E.  The patient has a temperature over 102 F (39C)  F.  The patient vomited and now is having trouble breathing

## 2020-09-19 ENCOUNTER — Emergency Department (HOSPITAL_COMMUNITY): Payer: Medicare Other

## 2020-09-19 ENCOUNTER — Other Ambulatory Visit: Payer: Self-pay

## 2020-09-19 ENCOUNTER — Emergency Department (HOSPITAL_COMMUNITY)
Admission: EM | Admit: 2020-09-19 | Discharge: 2020-09-19 | Disposition: A | Discharge status: Home / Self Care | Payer: Medicare Other | Attending: Emergency Medicine | Admitting: Emergency Medicine

## 2020-09-19 ENCOUNTER — Encounter (HOSPITAL_COMMUNITY): Payer: Self-pay

## 2020-09-19 DIAGNOSIS — U071 COVID-19: Secondary | ICD-10-CM

## 2020-09-19 DIAGNOSIS — Z79899 Other long term (current) drug therapy: Secondary | ICD-10-CM | POA: Diagnosis not present

## 2020-09-19 DIAGNOSIS — E119 Type 2 diabetes mellitus without complications: Secondary | ICD-10-CM | POA: Diagnosis not present

## 2020-09-19 DIAGNOSIS — I10 Essential (primary) hypertension: Secondary | ICD-10-CM | POA: Diagnosis not present

## 2020-09-19 DIAGNOSIS — R531 Weakness: Secondary | ICD-10-CM | POA: Diagnosis present

## 2020-09-19 DIAGNOSIS — Z794 Long term (current) use of insulin: Secondary | ICD-10-CM | POA: Diagnosis not present

## 2020-09-19 LAB — RESP PANEL BY RT-PCR (FLU A&B, COVID) ARPGX2
Influenza A by PCR: NEGATIVE
Influenza B by PCR: NEGATIVE
SARS Coronavirus 2 by RT PCR: POSITIVE — AB

## 2020-09-19 LAB — BASIC METABOLIC PANEL
Anion gap: 11 (ref 5–15)
BUN: 12 mg/dL (ref 8–23)
CO2: 24 mmol/L (ref 22–32)
Calcium: 8.6 mg/dL — ABNORMAL LOW (ref 8.9–10.3)
Chloride: 101 mmol/L (ref 98–111)
Creatinine, Ser: 0.85 mg/dL (ref 0.61–1.24)
GFR, Estimated: >60 mL/min (ref >60)
Glucose, Bld: 86 mg/dL (ref 70–99)
Potassium: 3.7 mmol/L (ref 3.5–5.1)
Sodium: 136 mmol/L (ref 135–145)

## 2020-09-19 LAB — CBC
HCT: 48.2 % (ref 39.0–52.0)
Hemoglobin: 16.1 g/dL (ref 13.0–17.0)
MCH: 28.4 pg (ref 26.0–34.0)
MCHC: 33.4 g/dL (ref 30.0–36.0)
MCV: 85.2 fL (ref 80.0–100.0)
Platelets: 140 10*3/uL — ABNORMAL LOW (ref 150–400)
RBC: 5.66 MIL/uL (ref 4.22–5.81)
RDW: 13.1 % (ref 11.5–15.5)
WBC: 6.8 10*3/uL (ref 4.0–10.5)
nRBC: 0 % (ref 0.0–0.2)

## 2020-09-19 LAB — CBG MONITORING, ED: Glucose-Capillary: 114 mg/dL — ABNORMAL HIGH (ref 70–99)

## 2020-09-19 IMAGING — DX DG CHEST 1V PORT
1 series · 1 of 1 positions shown · non-contrast
Comparison: [DATE].

CLINICAL DATA: Body aches, chills, headache, difficulty sleeping.

EXAM:
PORTABLE CHEST 1 VIEW

[chest ap]
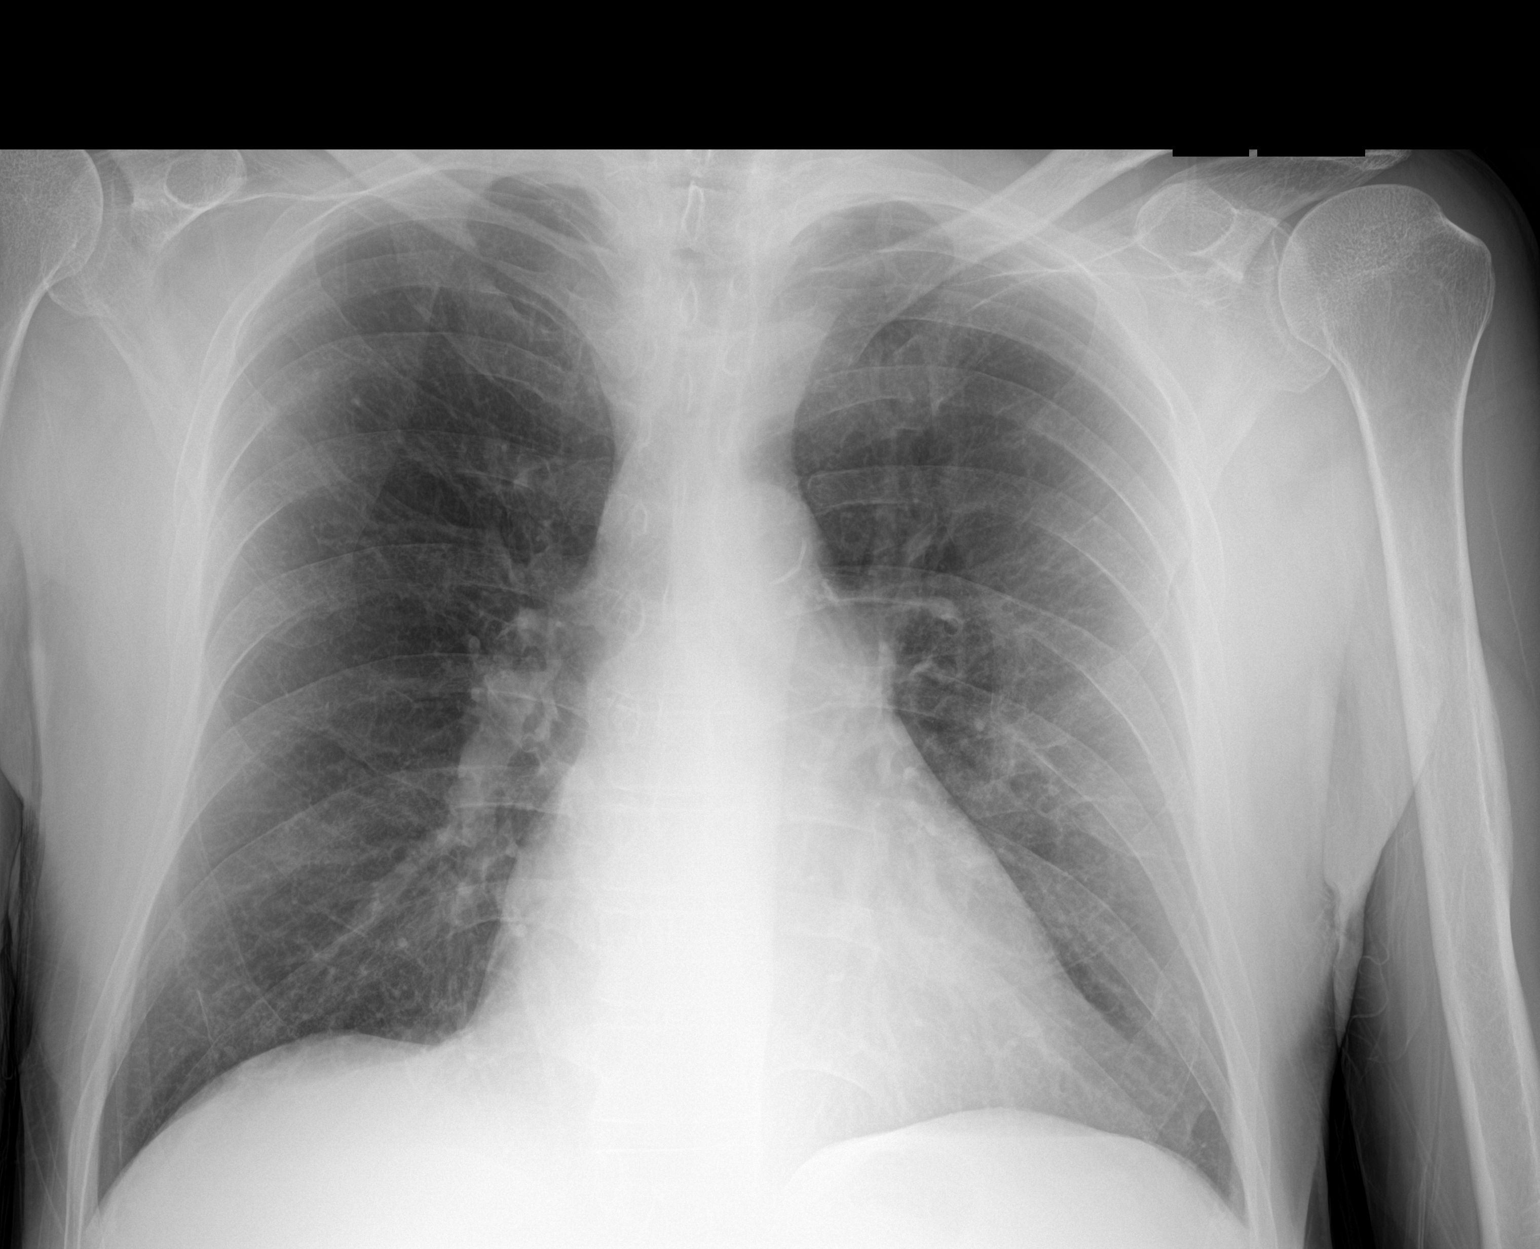

[1 of 1 positions shown; findings below may reference images not displayed]

FINDINGS: Trachea is midline. Heart size stable. Streaky densities in the
lingula, likely atelectasis. Lungs are otherwise clear. No pleural
fluid.
IMPRESSION: No acute findings.

## 2020-09-19 MED ORDER — BENZONATATE 100 MG PO CAPS: 100.0000 mg | ORAL_CAPSULE | Freq: Three times a day (TID) | ORAL | 0 refills | Status: DC

## 2020-09-19 MED ORDER — ONDANSETRON 4 MG PO TBDP: ORAL_TABLET | ORAL | 0 refills | Status: DC

## 2020-09-19 MED ORDER — NIRMATRELVIR/RITONAVIR (PAXLOVID)TABLET: 3.0000 | ORAL_TABLET | Freq: Two times a day (BID) | ORAL | 0 refills | Status: AC

## 2020-09-19 NOTE — Discharge Instructions (Addendum)
You have tested positive for the coronavirus.  Please return for worsening trouble breathing.  The medication I prescribed you has shown some promise and perhaps keeping you out of the hospital with this.  It can interact with your medication that lowers your blood pressure.  Please check your blood pressure daily to ensure that its not going too low.

## 2020-09-19 NOTE — ED Provider Notes (Signed)
[facility]-EMERGENCY DEPT Provider Note   CSN: [id number] Arrival date & time: 09/19/20  1246     History Chief Complaint  Patient presents with   Weakness    Mark English is a 70 y.o. male.  70 yo M with a chief complaints of cough congestion fevers chills myalgias going on for about 4 days now.  His father was sick.  The history is provided by the patient.  Weakness Severity:  Mild Onset quality:  Sudden Duration:  2 hours Timing:  Constant Progression:  Worsening Chronicity:  New Relieved by:  Nothing Worsened by:  Nothing Ineffective treatments:  None tried Associated symptoms: fever and myalgias   Associated symptoms: no abdominal pain, no arthralgias, no chest pain, no diarrhea, no headaches, no shortness of breath and no vomiting       Past Medical History:  Diagnosis Date   Atrioventricular block, Mobitz type 2    Diabetes mellitus without complication (HCC)    Disease of intestine    Hypertension    Irregular heart beat    Type 2 diabetes mellitus with hyperglycemia (HCC)    Unspecified urinary incontinence     There are no problems to display for this patient.   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 Authorizing Provider  benzonatate (TESSALON) 100 MG capsule Take 1 capsule (100 mg total) by mouth every 8 (eight) hours. 09/19/20  Yes Melene Plan, DO  nirmatrelvir/ritonavir EUA (PAXLOVID) TABS Take 3 tablets by mouth 2 (two) times daily for 5 days. Take nirmatrelvir (150  mg) two tablets twice daily for 5 days and ritonavir (100 mg) one tablet twice daily for 5 days. 09/19/20 09/24/20 Yes Melene Plan, DO  ondansetron (ZOFRAN ODT) 4 MG disintegrating tablet 4mg  ODT q4 hours prn nausea/vomit 09/19/20  Yes 11/20/20, Hagen Tidd, DO  insulin lispro (HUMALOG) 100 UNIT/ML injection Inject into the skin in the morning and at bedtime.    [provider]  losartan (COZAAR) 50 MG tablet Take 50 mg by mouth 2 (two) times daily.    [provider]  oxcarbazepine (TRILEPTAL) 600 MG tablet Take 1 tablet (600 mg total) by mouth 2 (two) times daily. 09/02/20   09/04/20, MD  Vitamin D, Ergocalciferol, (DRISDOL) 1.25 MG (50000 UNIT) CAPS capsule Take 50,000 Units by mouth every 7 (seven) days.    [provider]    Allergies    Vicodin [hydrocodone-acetaminophen]  Review of Systems   Review of Systems  Constitutional:  Positive for chills and fever.  HENT:  Positive for congestion. Negative for facial swelling.   Eyes:  Negative for discharge and visual disturbance.  Respiratory:  Negative for shortness of breath.   Cardiovascular:  Negative for chest pain and palpitations.  Gastrointestinal:  Negative for abdominal pain, diarrhea and vomiting.  Musculoskeletal:  Positive for myalgias. Negative for arthralgias.  Skin:  Negative for color change and rash.  Neurological:  Positive for weakness. Negative for tremors, syncope and headaches.  Psychiatric/Behavioral:  Negative for confusion and dysphoric mood.    Physical Exam Updated Vital Signs BP (!) 140/56 (BP Location: Right Arm)   Pulse 63   Temp (!) 100.6 F (38.1 C) (Oral)   Resp 20   Ht 5\' 10"  (1.778 m)   Wt 78 kg   SpO2 96%   BMI 24.67 kg/m   Physical Exam Vitals and nursing note reviewed.  Constitutional:      Appearance: He is well-developed.  HENT:     Head: Normocephalic and atraumatic.  Eyes:     Pupils: Pupils are equal, round, and reactive to light.  Neck:     Vascular: No JVD.   Cardiovascular:     Rate and Rhythm: Normal rate and regular rhythm.     Heart sounds: No murmur heard.   No friction rub. No gallop.  Pulmonary:     Effort: No respiratory distress.     Breath sounds: No wheezing.  Abdominal:     General: There is no distension.     Tenderness: There is no abdominal tenderness. There is no guarding or rebound.  Musculoskeletal:        General: Normal range of motion.     Cervical back: Normal range of motion and neck supple.  Skin:    Coloration: Skin is not pale.     Findings: No rash.  Neurological:     Mental Status: He is alert and oriented to person, place, and time.  Psychiatric:        Behavior: Behavior normal.    ED Results / Procedures / Treatments   Labs (all labs ordered are listed, but only abnormal results are displayed) Labs Reviewed  RESP PANEL BY RT-PCR (FLU A&B, COVID) ARPGX2 - Abnormal; Notable for the following components:      Result Value   SARS Coronavirus 2 by RT PCR POSITIVE (*)    All other components within normal limits  CBC - Abnormal; Notable for the following components:   Platelets 140 (*)    All other components within normal limits  BASIC METABOLIC PANEL - Abnormal; Notable for the following components:   Calcium 8.6 (*)    All other components within normal limits  CBG MONITORING, ED - Abnormal; Notable for the following components:   Glucose-Capillary 114 (*)    All other components within normal limits    EKG None  Radiology DG Chest Portable 1 View  Result Date: 09/19/2020 CLINICAL DATA:  Body aches, chills, headache, difficulty sleeping. EXAM: PORTABLE CHEST 1 VIEW COMPARISON:  07/01/2020. FINDINGS: Trachea is midline. Heart size stable. Streaky densities in the lingula, likely atelectasis. Lungs are otherwise clear. No pleural fluid. IMPRESSION: No acute findings. Electronically Signed   By: 07/03/2020 M.D.   On: 09/19/2020 14:01    Procedures Procedures   Medications Ordered in  ED Medications - No data to display  ED Course  I have reviewed the triage vital signs and the nursing notes.  Pertinent labs & imaging results that were available during my care of the patient were reviewed by me and considered in my medical decision making (see chart for details).    MDM Rules/Calculators/A&P                          70 yo M with a cc of cough congestion fevers chills nausea going on for about 4 days.  Found to have the coronavirus here.  I discussed with the pharmacist who felt it was reasonable to start him on Paxlovid.  We will have him follow-up with his family doctor.  3:02 PM:  I have discussed the diagnosis/risks/treatment options with the patient and believe the pt to be eligible for discharge home to follow-up with PCP. We also discussed returning to the ED immediately if new or worsening sx occur. We discussed the sx which are most concerning (e.g., sudden worsening sob, fever, inability to tolerate by mouth) that necessitate immediate return. Medications administered to the patient during their visit and any new prescriptions provided to the patient are listed below.  Medications given during this visit Medications - No data to display   The patient appears reasonably screen and/or stabilized for discharge and I doubt any other medical condition or other [facility] requiring further screening, evaluation, or treatment in the ED at this time prior to discharge.   Final Clinical Impression(s) / ED Diagnoses Final diagnoses:  COVID-19 virus infection    Rx / DC Orders ED Discharge Orders          Ordered    nirmatrelvir/ritonavir EUA (PAXLOVID) TABS  2 times daily        09/19/20 1458    benzonatate (TESSALON) 100 MG capsule  Every 8 hours        09/19/20 1458    ondansetron (ZOFRAN ODT) 4 MG disintegrating tablet        09/19/20 1458             Melene Plan, DO 09/19/20 1502

## 2020-09-19 NOTE — ED Triage Notes (Signed)
Per patient body aches, chills, headache, unable to sleep x4 days.

## 2020-12-28 ENCOUNTER — Other Ambulatory Visit: Payer: Self-pay

## 2020-12-28 ENCOUNTER — Ambulatory Visit (INDEPENDENT_AMBULATORY_CARE_PROVIDER_SITE_OTHER): Payer: Medicare Other | Admitting: Neurology

## 2020-12-28 ENCOUNTER — Encounter: Payer: Self-pay | Admitting: Neurology

## 2020-12-28 VITALS — BP 149/71 | HR 66 | Ht 70.0 in | Wt 167.0 lb

## 2020-12-28 DIAGNOSIS — R9089 Other abnormal findings on diagnostic imaging of central nervous system: Secondary | ICD-10-CM

## 2020-12-28 DIAGNOSIS — G40109 Localization-related (focal) (partial) symptomatic epilepsy and epileptic syndromes with simple partial seizures, not intractable, without status epilepticus: Secondary | ICD-10-CM

## 2020-12-28 MED ORDER — OXCARBAZEPINE 600 MG PO TABS: 600.0000 mg | ORAL_TABLET | Freq: Two times a day (BID) | ORAL | 3 refills | Status: DC

## 2020-12-28 NOTE — Progress Notes (Signed)
NEUROLOGY FOLLOW UP OFFICE NOTE  Mark English [id number] 07-13-1950  HISTORY OF PRESENT ILLNESS: I had the pleasure of seeing Mark English in follow-up in the neurology clinic on 12/28/2020.  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 He is alone in the office today. Since his last visit, he reports discontinuing the oxcarbazepine. He was not having any side effects, he states "I'm just not a pill-taking person." He stopped all his medications except the insulin and eye drops. He has not noticed much change in his situation with emotional control. He reports more and more problems maintaining control of emotions, anything will set him crying. Some are triggered, such as when speaking to his grandchildren or watching a movie, but it really bothers him because "it's not me." He notes he would have the sensation of dread, he hears his voice cracking and gets tearful. Right arm is not affected like before, "just the emotional control." They last 5-10 minutes then "sort of goes away" and he is abck to normal. He gets cramps in his right hand every now and then, but not quite as often as they did before. He lives with his wife who has not mentioned any staring episodes, he denies any gaps in time. He does note his memory is worsening. He denies getting lost driving, no missed bill payments or insulin doses.    History on Initial Assessment 01/27/2020: This is a 70 year old left-handed man with a history of hypertension, diabetes, AV block, cognitive impairment, chronic PTSD, presenting for evaluation of focal seizures. He states symptoms have been going on for years, but became more noticeable in the past year or so. He describes episodes starting with a sudden "real serious sense of dread." His voice would change, it sounds like he is almost ready to cry, like he is straining to speak, then almost immediately his right arm becomes hard to control. If he is holding a drink, it  would spill. His hand is like a claw, then goes in a fist. He can voluntarily flex at the elbow and hold it his arm to his shoulder until the episode subsides in a couple of minutes. There is no associated loss of awareness, his wife denies any staring/unresponsive episodes. No focal jerking, numbness/tingling/post-event weakness. They can occur multiple times a day, longest seizure-free interval of 1-2 days. He has noticed that when he is playing computer games, he can have 3-4 episodes within 10 minutes. His wife has not noticed any nocturnal episodes. Face and leg are not affected. He denies any gaps in time, olfactory/gustatory hallucinations, myoclonic jerks. He has rare episodes of deja vu. He denies any headaches, dizziness, diplopia, dysarthria/dysphagia, neck/back pain, bowel/bladder dysfunction. He had an EEG done 01/27/20 which captured a focal electrographic seizure arising from the left temporal region. He started Levetiracetam 500mg  BID 2 weeks ago and feels there "may be" a little reduction with the seizures, they are not as often as they used to be. He denies any side effects however his wife notes he is " ."   He has had memory issues since his last 40s/early 38s. His wife started noticing changes 3-4 years ago. He forgets conversations. His wife reports he puts dishes in the wrong place despite repeated instructions. He forgets things they did or places they went. He denies getting lost driving. He occasionally forgets his medications. His wife manages finances. His parents have memory problems. He denies any alcohol use. 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 His wife is concerned he was exposed to an extremely small amount of [place] gas in the [place] war in 1991. 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 His wife reports he is extremely restless when he sleeps, talks in his sleep.  He has a diagnosis of PTSD. He is on Donepezil and Memantine.   Epilepsy Risk Factors:  He had a normal birth and early development.  There is no history of febrile convulsions, CNS infections such as meningitis/encephalitis, significant traumatic brain injury, neurosurgical procedures, or family history of seizures.  Prior ASMs: Levetiracetam (mood swings)  Diagnostic Data: EEG done 01/27/20 which captured a focal electrographic seizure arising from the left temporal region MRI brain with and without contrast done 03/2020 showed subtle increased T2 signal within the left amygdala with patchy contrast enhancement. No mass effect. Findings may be related to autoimmune encephalitis, infectious encephalitis, recent seizure activity and low-grade glioma. Associated volume loss of the ipsilateral hippocampus favors longstanding process.  PAST MEDICAL HISTORY: Past Medical History:  Diagnosis Date   Atrioventricular block, Mobitz type 2    Diabetes mellitus without complication (HCC)    Disease of intestine    Hypertension    Irregular heart beat    Type 2 diabetes mellitus with hyperglycemia (HCC)    Unspecified urinary incontinence     MEDICATIONS: Current Outpatient Medications on File Prior to Visit  Medication Sig Dispense Refill   benzonatate (TESSALON) 100 MG capsule Take 1 capsule (100 mg total) by mouth every 8 (eight) hours. 21 capsule 0   insulin lispro (HUMALOG) 100 UNIT/ML injection Inject into the skin in the morning and at bedtime.     losartan (COZAAR) 50 MG tablet Take 50 mg by mouth 2 (two) times daily.     ondansetron (ZOFRAN ODT) 4 MG disintegrating tablet 4mg  ODT q4 hours prn nausea/vomit 20 tablet 0   oxcarbazepine (TRILEPTAL) 600 MG tablet Take 1 tablet (600 mg total) by mouth 2 (two) times daily. 180 tablet 3   Vitamin D, Ergocalciferol, (DRISDOL) 1.25 MG (50000 UNIT) CAPS capsule Take 50,000 Units by mouth every 7 (seven) days.     No current facility-administered  medications on file prior to visit.    ALLERGIES: Allergies  Allergen Reactions   Vicodin [Hydrocodone-Acetaminophen] Other (See Comments)    Blood pressure dropped    FAMILY HISTORY: Family History  Problem Relation Age of Onset   Diabetes Mother    Heart disease Father     SOCIAL HISTORY: Social History   Socioeconomic History   Marital status: Married    Spouse name: Not on file   Number of children: 5   Years of education: Not on file   Highest education level: Not on file  Occupational History   Occupation: retired  Tobacco Use   Smoking status: Never   Smokeless tobacco: Never  Vaping Use   Vaping Use: Never used  Substance and Sexual Activity   Alcohol use: Not Currently   Drug use: No   Sexual activity: Not on file  Other Topics Concern   Not on file  Social History Narrative   Left handed    Lives with wife   Social Determinants of Health   Financial Resource Strain: Not on file  Food Insecurity: Not on file  Transportation Needs: Not on file  Physical Activity: Not on file  Stress: Not on file  Social Connections: Not on file  Intimate Partner Violence: Not on file     PHYSICAL EXAM: Vitals:   12/28/20 1451  BP: (!) 149/71  Pulse: 66  SpO2: 98%   General: No acute distress Head:  Normocephalic/atraumatic Skin/Extremities: No rash, no edema Neurological Exam: alert and oriented to person, place, and time. No aphasia or dysarthria. Fund of knowledge is appropriate.  Recent and remote memory are intact, 3/3 delayed recall.  Attention and concentration are normal, 5/5 serial 7s. Cranial nerves: Pupils equal, round. Extraocular movements intact with no nystagmus. Visual fields full.  No facial asymmetry.  Motor: Bulk and tone normal, muscle strength 5/5 throughout with no pronator drift.   Finger to nose testing intact.  Gait narrow-based and steady, able to tandem walk adequately.  Romberg negative.   IMPRESSION: This is a 70 yo LH  man with a history of hypertension, diabetes, AV block, cognitive impairment, chronic PTSD, with left temporal lobe epilepsy. MRI brain in 03/2020 showed increased signal in the left amygdala with patchy contrast enhancement, likely related to recent seizure activity, although differentials include low grade glioma, autoimmune encephalitis, and infectious encephalitis. The volume loss in the left hippocampus favors a longstanding process, however repeat MRI brain with and without contrast will be ordered to assess stability. His EEG captured an electrographic seizure arising from the left temporal region. He stopped seizure medication because he does not like taking medications. We discussed risks of uncontrolled seizures, he is agreeable to restarting oxcarbazepine 600mg  BID. He continues to report a lot of emotionality/emotional control issues and will look into seeing a therapist through the , he will let [place] know if he would like referral through [facility]. He is aware of [place] driving laws to stop driving after a seizure until 6 months seizure-free. Follow-up in 4-5 months, call for any changes.    Thank you for allowing me to participate in his care.  Please do not hesitate to call for any questions or concerns.    Mark English, M.D.   CC: Dr. Patrcia Dolly

## 2020-12-28 NOTE — Patient Instructions (Signed)
Good to see you.  Schedule MRI brain with and without contrast  2. Restart oxcarbazepine 600mg : take 1 tablet twice a day  3. Would proceed with seeing a therapist, either through the or through [facility], let me know if you need a referral  4. Follow-up in 4-5 months, call for any changes   Seizure Precautions: 1. If medication has been prescribed for you to prevent seizures, take it exactly as directed.  Do not stop taking the medicine without talking to your doctor first, even if you have not had a seizure in a long time.   2. Avoid activities in which a seizure would cause danger to yourself or to others.  Don't operate dangerous machinery, swim alone, or climb in high or dangerous places, such as on ladders, roofs, or girders.  Do not drive unless your doctor says you may.  3. If you have any warning that you may have a seizure, lay down in a safe place where you can't hurt yourself.    4.  No driving for 6 months from last seizure, as per [facility].   Please refer to the following link on the Epilepsy Foundation of America's website for more information: http://www.epilepsyfoundation.org/answerplace/Social/driving/drivingu.cfm   5.  Maintain good sleep hygiene. Avoid alcohol  6.  Contact your doctor if you have any problems that may be related to the medicine you are taking.  7.  Call 911 and bring the patient back to the ED if:        A.  The seizure lasts longer than 5 minutes.       B.  The patient doesn't awaken shortly after the seizure  C.  The patient has new problems such as difficulty seeing, speaking or moving  D.  The patient was injured during the seizure  E.  The patient has a temperature over 102 F (39C)  F.  The patient vomited and now is having trouble breathing

## 2021-01-16 ENCOUNTER — Other Ambulatory Visit: Payer: Self-pay

## 2021-01-16 ENCOUNTER — Ambulatory Visit
Admission: RE | Admit: 2021-01-16 | Discharge: 2021-01-16 | Disposition: A | Discharge status: Home / Self Care | Payer: Medicare Other | Source: Ambulatory Visit | Attending: Neurology | Admitting: Neurology

## 2021-01-16 DIAGNOSIS — R9089 Other abnormal findings on diagnostic imaging of central nervous system: Secondary | ICD-10-CM

## 2021-01-16 DIAGNOSIS — G40109 Localization-related (focal) (partial) symptomatic epilepsy and epileptic syndromes with simple partial seizures, not intractable, without status epilepticus: Secondary | ICD-10-CM

## 2021-01-16 IMAGING — MR MR HEAD WO/W CM
15 of 16 series · 41 of 48 positions shown · IV contrast (multihance)
Comparison: MRI [DATE]

CLINICAL DATA: seizure

EXAM:
MRI HEAD WITHOUT AND WITH CONTRAST
TECHNIQUE: Multiplanar, multiecho pulse sequences of the brain and surrounding
structures were obtained without and with intravenous contrast.
CONTRAST:  15mL MULTIHANCE GADOBENATE DIMEGLUMINE 529 MG/ML IV SOLN

[Series 2: T1 · sagittal · 5.0mm · 0.45mm/px · 2 of 21 slices shown]
[im 1/21]
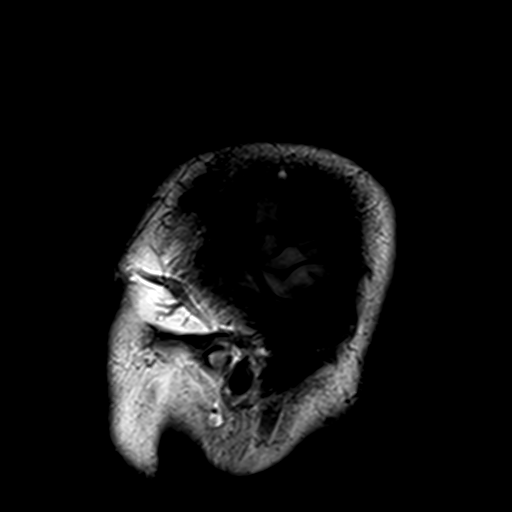
[im 21/21]
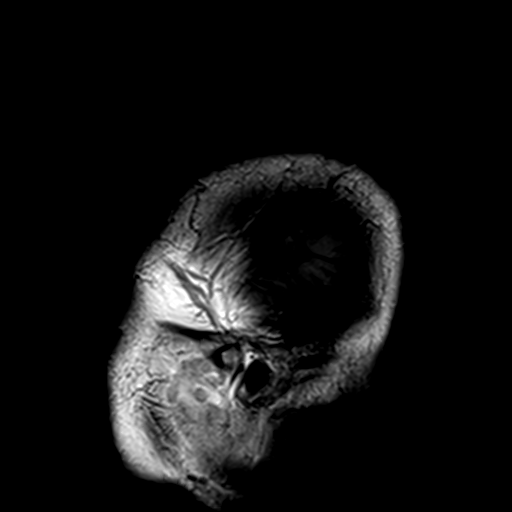

[Series 3: DWI · axial · 3.0mm · 1.80mm/px · z∈[-61,+83]mm · 6 of 100 slices shown (1 of 4)]
[im 1/100]
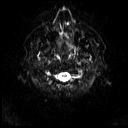
[im 20/100]
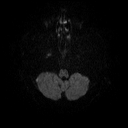
[im 40/100]
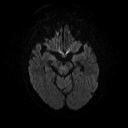
[im 60/100]
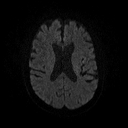
[im 80/100]
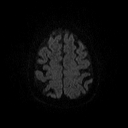
[im 100/100]
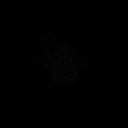

[Series 4: DWI · axial · 3.0mm · 1.80mm/px · z∈[-61,+83]mm · 3 of 50 slices shown (2 of 4)]
[im 1/50]
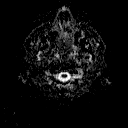
[im 25/50]
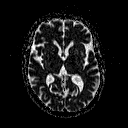
[im 50/50]
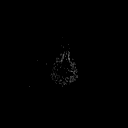

[Series 5: DWI · coronal · 5.0mm · 1.80mm/px · 4 of 68 slices shown (3 of 4)]
[im 1/68]
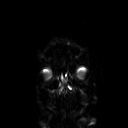
[im 23/68]
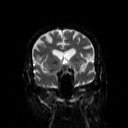
[im 45/68]
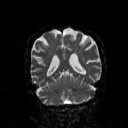
[im 68/68]
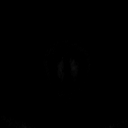

[Series 6: DWI · coronal · 5.0mm · 1.80mm/px · 2 of 34 slices shown (4 of 4)]
[im 1/34]
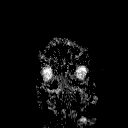
[im 34/34]
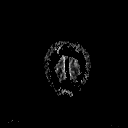

[Series 7: T2 · axial · 5.0mm · 0.60mm/px · 1 of 22 slices shown (1 of 3)]
[im 1/22]
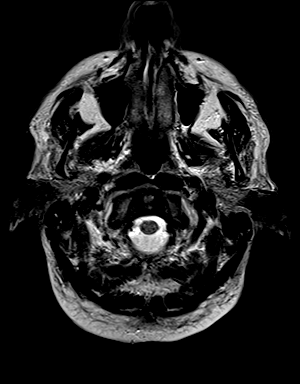

[Series 8: FLAIR · axial · 3.0mm · 0.45mm/px · z∈[-62,+84]mm · 2 of 33 slices shown (1 of 2)]
[im 1/33]
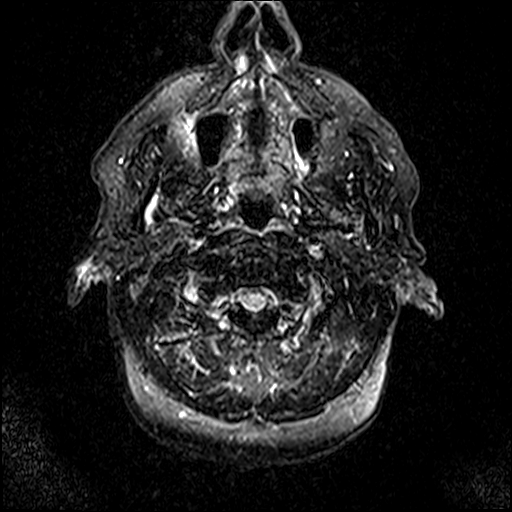
[im 33/33]
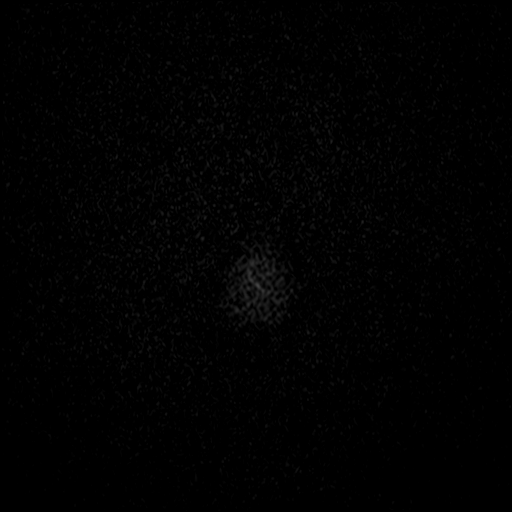

[Series 10: swi_images · axial · 4.0mm · 0.90mm/px · z∈[-57,+80]mm · 2 of 36 slices shown]
[im 1/36]
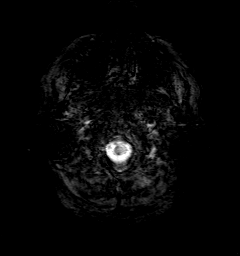
[im 36/36]
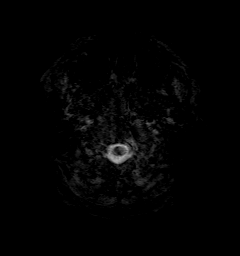

[Series 11: FLAIR · coronal · 3.0mm · 0.35mm/px · 2 of 30 slices shown (2 of 2)]
[im 1/30]
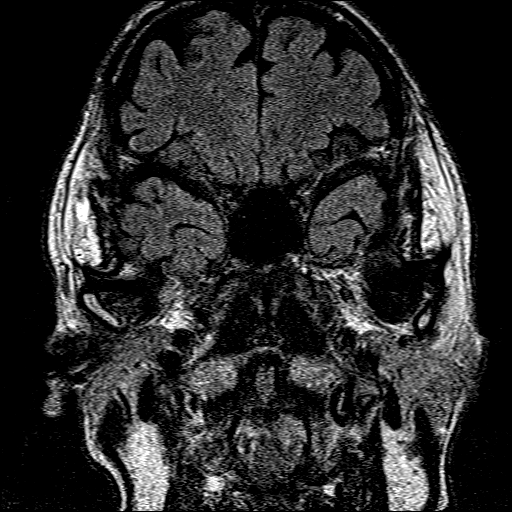
[im 30/30]
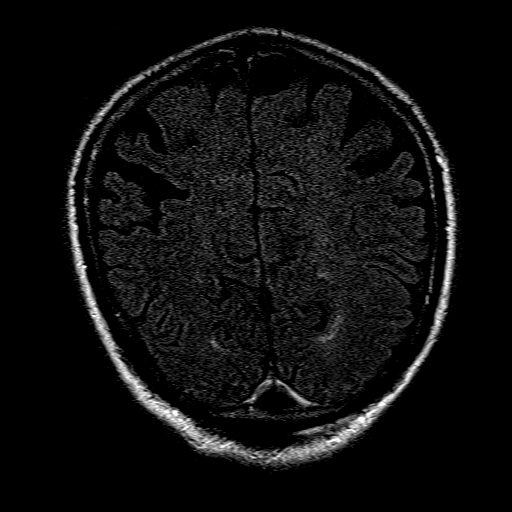

[Series 12: T2 · coronal · 3.0mm · 0.23mm/px · 2 of 30 slices shown (2 of 3)]
[im 1/30]
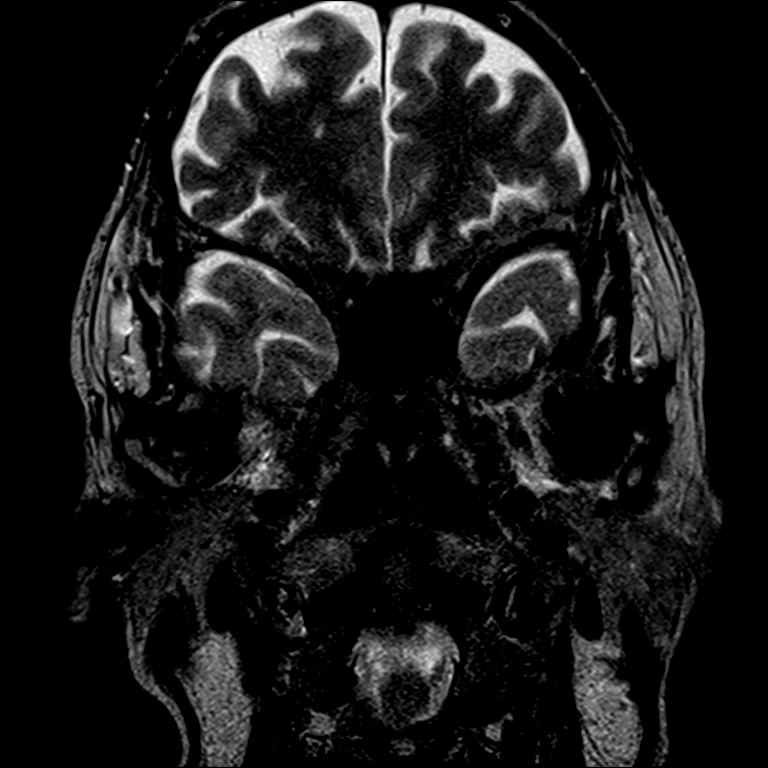
[im 30/30]
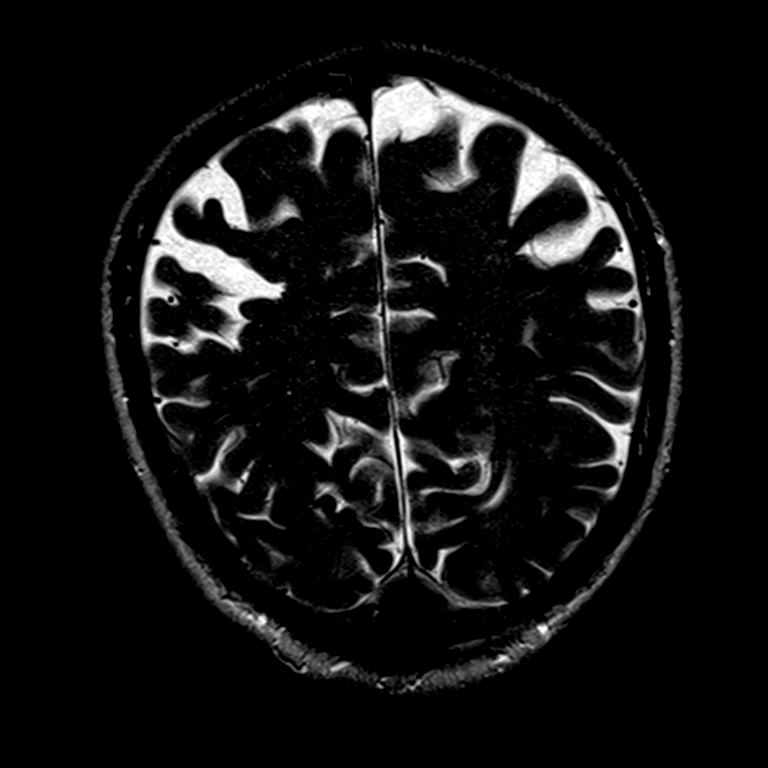

[Series 13: t1_mpr_tra · axial · 1.0mm · 0.75mm/px · z∈[-57,+83]mm · 8 of 144 slices shown]
[im 1/144]
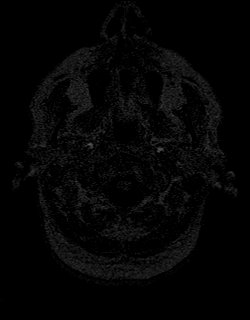
[im 21/144]
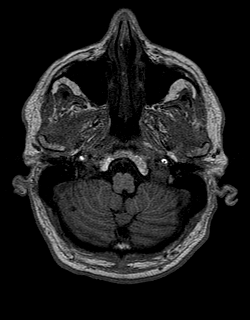
[im 41/144]
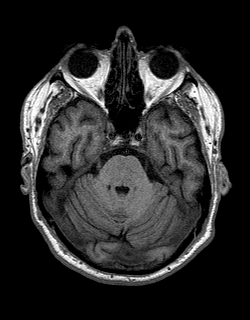
[im 62/144]
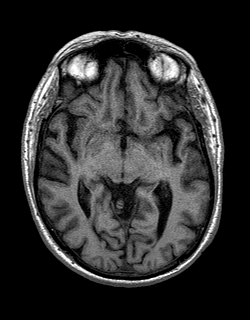
[im 82/144]
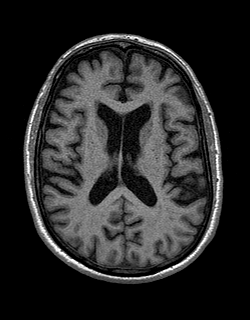
[im 103/144]
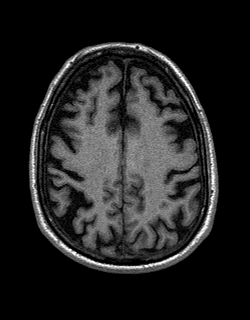
[im 123/144]
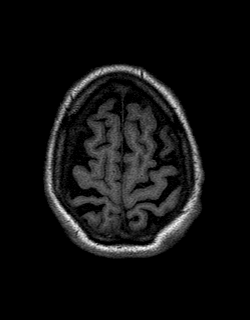
[im 144/144]
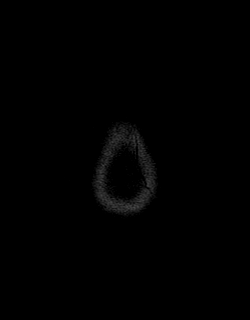

[Series 14: T2 · coronal · 5.0mm · 0.45mm/px · 1 of 26 slices shown (3 of 3)]
[im 1/26]
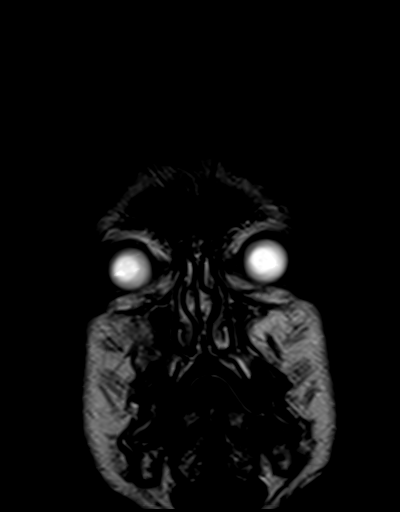

[Series 15: reformat cor · coronal · 1.0mm · 0.75mm/px · 3 of 55 slices shown]
[im 1/55]
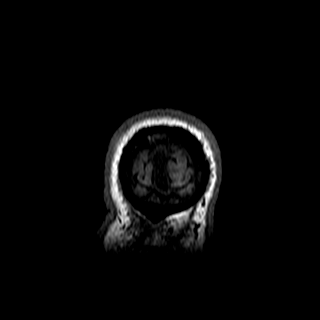
[im 28/55]
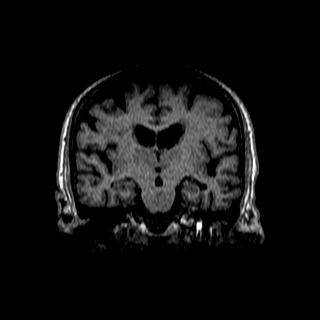
[im 55/55]
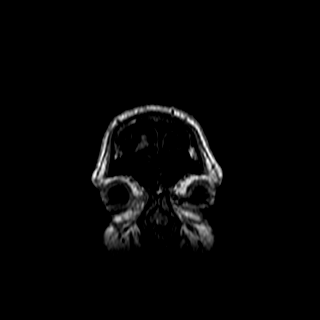

[Series 16: t1_mpr_tra post · axial · 1.0mm · 0.75mm/px · z∈[-57,-38]mm · 2 of 144 slices shown]
[im 1/144]
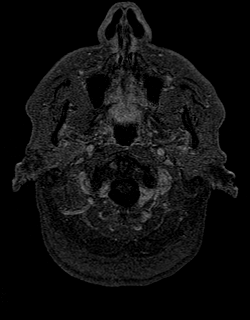
[im 21/144]
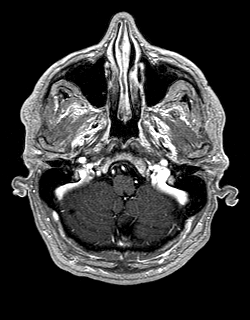

[Series 18: T1 post-contrast · sagittal · 5.0mm · 0.45mm/px · 1 of 21 slices shown]
[im 1/21]
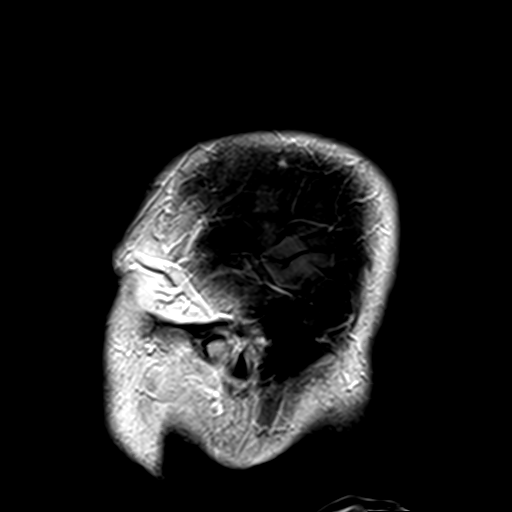

[41 of 48 positions shown; findings below may reference images not displayed]

FINDINGS: Brain: No acute infarction, hemorrhage, hydrocephalus, extra-axial
collection or mass lesion. Chronic infarct in the right cerebellum.
Similar patchy FLAIR hyperintensity in the white matter, nonspecific
compatible with chronic microvascular ischemic disease.

No substantial change in subtle patchy enhancement of the left
hippocampus with asymmetric left hippocampal volume loss. Similar
mild volume loss of the left hippocampus. No definite T2
hyperintensity, which was subtle on the prior. No expansion.

Vascular: Major arterial flow voids are maintained skull base.

Skull and upper cervical spine: Normal marrow signal.

Sinuses/Orbits: Clear sinuses.  Unremarkable orbits.

Other: Moderate left mastoid effusion.
IMPRESSION: 1. Similar subtle patchy enhancement of the left amygdala with
asymmetric left hippocampal volume loss. Similar differential
considerations, including encephalitis (more likely autoimmune than
infectious given stability/chronicity), sequela of recent seizure
activity, or low-grade glioma. The enhancement is atypical for
mesial temporal sclerosis.
2. Remote lacunar infarct in the right cerebellum mild chronic
microvascular ischemic disease.

## 2021-01-16 MED ORDER — GADOBENATE DIMEGLUMINE 529 MG/ML IV SOLN
15.0000 mL | Freq: Once | INTRAVENOUS | Status: AC | PRN
  Administered 2021-01-16: 15 mL via INTRAVENOUS

## 2021-01-23 ENCOUNTER — Telehealth: Payer: Self-pay

## 2021-01-23 NOTE — Telephone Encounter (Signed)
-----   Message from Van Clines, MD sent at 01/23/2021  8:41 AM EST ----- Pls let him know that the brain MRI is unchanged from his last scan. The changes seen on the left side are the same, no growth or worsening. We will continue to monitor and repeat scan in a year, unless he has a change in how he is feeling. How is he feeling back on the oxcarbazepine and has he contacted the [place] for a therapist or would he like [place] to send referral? Thanks

## 2021-01-23 NOTE — Telephone Encounter (Signed)
Spoke with pt and informed him that MRI is unchanged from his last scan. The changes seen on the left side are the same, no growth or worsening. We will continue to monitor and repeat scan in a year, unless he has a change . How is he feeling back on the oxcarbazepine? Pt stated that he is feeling fine on the oxcarbazepine,  and has he contacted the [place] for a therapist or would he like [place] to send referral? He has to contact the [place] on his todo list he will call them

## 2021-05-03 ENCOUNTER — Ambulatory Visit: Payer: Medicare Other | Admitting: Neurology

## 2021-05-24 ENCOUNTER — Ambulatory Visit (INDEPENDENT_AMBULATORY_CARE_PROVIDER_SITE_OTHER): Payer: Medicare Other | Admitting: Neurology

## 2021-05-24 ENCOUNTER — Encounter: Payer: Self-pay | Admitting: Neurology

## 2021-05-24 ENCOUNTER — Other Ambulatory Visit: Payer: Self-pay

## 2021-05-24 VITALS — BP 155/71 | HR 69 | Resp 18 | Ht 70.0 in | Wt 159.0 lb

## 2021-05-24 DIAGNOSIS — G40109 Localization-related (focal) (partial) symptomatic epilepsy and epileptic syndromes with simple partial seizures, not intractable, without status epilepticus: Secondary | ICD-10-CM | POA: Diagnosis not present

## 2021-05-24 MED ORDER — OXCARBAZEPINE 600 MG PO TABS: ORAL_TABLET | ORAL | 3 refills | Status: AC

## 2021-05-24 NOTE — Patient Instructions (Signed)
Good to see you. ? ?Increase oxcarbazepine 600mg : take 1 and 1/2 tablets twice a day ? ?2. Keep a calendar of your symptoms ? ?3. Follow-up in 3 months, call for any changes ? ? ?Seizure Precautions: ?1. If medication has been prescribed for you to prevent seizures, take it exactly as directed.  Do not stop taking the medicine without talking to your doctor first, even if you have not had a seizure in a long time.  ? ?2. Avoid activities in which a seizure would cause danger to yourself or to others.  Don't operate dangerous machinery, swim alone, or climb in high or dangerous places, such as on ladders, roofs, or girders.  Do not drive unless your doctor says you may. ? ?3. If you have any warning that you may have a seizure, lay down in a safe place where you can't hurt yourself.   ? ?4.  No driving for 6 months from last seizure, as per [facility].   Please refer to the following link on the Epile[employer] of [employer]'s website for more information: http://www.epilepsyfoundation.org/answerplace/Social/driving/drivingu.cfm  ? ?5.  Maintain good sleep hygiene. Avoid alcohol. ? ?6.  Contact your doctor if you have any problems that may be related to the medicine you are taking. ? ?7.  Call 911 and bring the patient back to the ED if: ?      ? A.  The seizure lasts longer than 5 minutes.      ? B.  The patient doesn't awaken shortly after the seizure ? C.  The patient has new problems such as difficulty seeing, speaking or moving ? D.  The patient was injured during the seizure ? E.  The patient has a temperature over 102 F (39C) ? F.  The patient vomited and now is having trouble breathing ?      ? ?

## 2021-05-24 NOTE — Progress Notes (Signed)
? ?NEUROLOGY FOLLOW UP OFFICE NOTE ? ?Doreene Adaslan D Sublette ?[id number] ?04/12/1950 ? ?HISTORY OF PRESENT ILLNESS: ?I had the pleasure of seeing Jolyn Lentlan Daws in follow-up in the neurology clinic on 05/24/2021.  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 He is alone in the office today. Records and images were personally reviewed where available.  I personally reviewed repeat MRI brain with and without contrast done 01/2021 with no acute changes, no substantial change in subtle patchy enhancement in left hippocampus with asymmetric left hippocampal volume loss, similar differentials including autoimmune encephalitis, sequela of recent seizure activity, or low grade glioma. Enhancement is atypical for mesial temporal sclerosis.  ? ?Since his last visit, he restarted oxcarbazepine 600mg  BID but continues to report episodes where his right hand would spasm and flex at the fingers, usually when trying to grab something. It occurs around twice a week for a few minutes. He also has the recurrent episodes of feeling of dread where his voice cracks, but they occur independently of the right hand symptoms now, also occurring frequently every week, lasting up to 5 minutes. He notices them a lot when watching TV shows that seem to trigger, which would typically not cause him to be emotional in the past. There is no associated confusion/speech difficulties, staring/unresponsiveness, olfactory/gustatory hallucinations. No headaches, dizziness, vision changes, no falls. No side effects on oxcarbazepine 600mg  BID. He usually gets 5-6 hours of sleep. ? ? ?History on Initial Assessment 01/27/2020: This is a 71 year old left-handed man with a history of hypertension, diabetes, AV block, cognitive impairment, chronic PTSD, presenting for evaluation of focal seizures. He states symptoms have been going on for years, but became more noticeable in the past year or so. He describes episodes starting with a sudden  "real serious sense of dread." His voice would change, it sounds like he is almost ready to cry, like he is straining to speak, then almost immediately his right arm becomes hard to control. If he is holding a drink, it would spill. His hand is like a claw, then goes in a fist. He can voluntarily flex at the elbow and hold it his arm to his shoulder until the episode subsides in a couple of minutes. There is no associated loss of awareness, his wife denies any staring/unresponsive episodes. No focal jerking, numbness/tingling/post-event weakness. They can occur multiple times a day, longest seizure-free interval of 1-2 days. He has noticed that when he is playing computer games, he can have 3-4 episodes within 10 minutes. His wife has not noticed any nocturnal episodes. Face and leg are not affected. He denies any gaps in time, olfactory/gustatory hallucinations, myoclonic jerks. He has rare episodes of deja vu. He denies any headaches, dizziness, diplopia, dysarthria/dysphagia, neck/back pain, bowel/bladder dysfunction. He had an EEG done 01/27/20 which captured a focal electrographic seizure arising from the left temporal region. He started Levetiracetam 500mg  BID 2 weeks ago and feels there "may be" a little reduction with the seizures, they are not as often as they used to be. 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 ?He has had memory issues since his last 40s/early 2350s. His wife started noticing changes 3-4 years ago. He forgets conversations. His wife reports he puts dishes in the wrong place despite repeated instructions. He forgets things they did or places they went. He denies getting lost driving. He occasionally forgets his medications. His wife manages finances. His parents have memory problems.  He denies any alcohol use. 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 His wife is concerned he was exposed to an  extremely small amount of Sarin gas in the [place] war in 1991. 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 His wife reports he is extremely restless when he sleeps, talks in his sleep. He has a diagnosis of PTSD. He is on Donepezil and Memantine.  ? ?Epilepsy Risk Factors:  He had a normal birth and early development.  There is no history of febrile convulsions, CNS infections such as meningitis/encephalitis, significant traumatic brain injury, neurosurgical procedures, or family history of seizures. ? ?Prior ASMs: Levetiracetam (mood swings) ? ?Diagnostic Data: ?EEG done 01/27/20 which captured a focal electrographic seizure arising from the left temporal region ?MRI brain with and without contrast done 03/2020 and 01/2021 showed subtle increased T2 signal within the left amygdala with patchy contrast enhancement. No mass effect. Findings may be related to autoimmune encephalitis, infectious encephalitis, recent seizure activity and low-grade glioma. Associated volume loss of the ipsilateral hippocampus favors longstanding process but enhancement is atypical for mesial temporal sclerosis ? ? ?PAST MEDICAL HISTORY: ?Past Medical History:  ?Diagnosis Date  ? Atrioventricular block, Mobitz type 2   ? Diabetes mellitus without complication (HCC)   ? Disease of intestine   ? Hypertension   ? Irregular heart beat   ? Type 2 diabetes mellitus with hyperglycemia (HCC)   ? Unspecified urinary incontinence   ? ? ?MEDICATIONS: ?Current Outpatient Medications on File Prior to Visit  ?Medication Sig Dispense Refill  ? insulin lispro (HUMALOG) 100 UNIT/ML injection Inject into the skin in the morning and at bedtime.    ? Insulin Lispro Prot & Lispro (HUMALOG 75/25 MIX) (75-25) 100 UNIT/ML Kwikpen Inject into the skin. 20 units in the morning and 30 unit at night    ? oxcarbazepine (TRILEPTAL) 600 MG tablet Take 1 tablet (600 mg total) by mouth 2 (two) times daily. 180 tablet 3  ? ketorolac  (ACULAR) 0.5 % ophthalmic solution  (Patient not taking: Reported on 05/24/2021)    ? ofloxacin (OCUFLOX) 0.3 % ophthalmic solution  (Patient not taking: Reported on 05/24/2021)    ? prednisoLONE acetate (PRED FORTE) 1 % ophthalmic suspension  (Patient not taking: Reported on 05/24/2021)    ? ?No current facility-administered medications on file prior to visit.  ? ? ?ALLERGIES: ?Allergies  ?Allergen Reactions  ? Vicodin [Hydrocodone-Acetaminophen] Other (See Comments)  ?  Blood pressure dropped  ? ? ?FAMILY HISTORY: ?Family History  ?Problem Relation Age of Onset  ? Diabetes Mother   ? Heart disease Father   ? ? ?SOCIAL HISTORY: ?Social History  ? ?Socioeconomic History  ? Marital status: Married  ?  Spouse name: Not on file  ? Number of children: 5  ? Years of education: Not on file  ? Highest education level: Not on file  ?Occupational History  ? Occupation: retired Office manager  ?Tobacco Use  ? Smoking status: Never  ? Smokeless tobacco: Never  ?Vaping Use  ? Vaping Use: Never used  ?Substance and Sexual Activity  ? Alcohol use: Yes  ? Drug use: No  ? Sexual activity: Not on file  ?Other Topics Concern  ? Not on file  ?Social History Narrative  ? Left handed   ? Lives with wife  ? ?Social Determinants of Health  ? ?Financial Resource Strain: Not on  file  ?Food Insecurity: Not on file  ?Transportation Needs: Not on file  ?Physical Activity: Not on file  ?Stress: Not on file  ?Social Connections: Not on file  ?Intimate Partner Violence: Not on file  ? ? ? ?PHYSICAL EXAM: ?Vitals:  ? 05/24/21 1356  ?BP: (!) 155/71  ?Pulse: 69  ?Resp: 18  ?SpO2: 98%  ? ?General: No acute distress ?Head:  Normocephalic/atraumatic ?Skin/Extremities: No rash, no edema ?Neurological Exam: alert and awake. No aphasia or dysarthria. Fund of knowledge is appropriate.   Attention and concentration are normal.   Cranial nerves: Pupils equal, round. Extraocular movements intact with no nystagmus. Visual fields full.  No facial asymmetry.  Motor: Bulk  and tone normal, muscle strength 5/5 throughout with no pronator drift.   Finger to nose testing intact.  Gait narrow-based and steady, able to tandem walk adequately.  Romberg negative. ? ? ?IMPRESSION: ?Thi

## 2021-10-02 ENCOUNTER — Ambulatory Visit: Payer: Medicare Other | Admitting: Neurology

## 2022-04-25 ENCOUNTER — Encounter: Payer: Self-pay | Admitting: Neurology

## 2022-04-25 ENCOUNTER — Ambulatory Visit (INDEPENDENT_AMBULATORY_CARE_PROVIDER_SITE_OTHER): Payer: Medicare Other | Admitting: Neurology

## 2022-04-25 VITALS — BP 143/73 | HR 76 | Ht 70.0 in | Wt 162.4 lb

## 2022-04-25 DIAGNOSIS — R9089 Other abnormal findings on diagnostic imaging of central nervous system: Secondary | ICD-10-CM

## 2022-04-25 DIAGNOSIS — G40109 Localization-related (focal) (partial) symptomatic epilepsy and epileptic syndromes with simple partial seizures, not intractable, without status epilepticus: Secondary | ICD-10-CM

## 2022-04-25 NOTE — Patient Instructions (Signed)
Good to see you doing well. Continue Oxcarbazepine 664m: take 1 and 1/2 tablets twice a day. [place] [facility] [place] [facility] needed, call for any changes.    Seizure Precautions: 1. If medication has been prescribed for you to prevent seizures, take it exactly as directed.  Do not stop taking the medicine without talking to your doctor first, even if you have not had a seizure in a long time.   2. Avoid activities in which a seizure would cause danger to yourself or to others.  Don't operate dangerous machinery, swim alone, or climb in high or dangerous places, such as on ladders, roofs, or girders.  Do not drive unless your doctor says you may.  3. If you have any warning that you may have a seizure, lay down in a safe place where you can't hurt yourself.    4.  No driving for 6 months from last seizure, [facility]   Please refer to the following link on the EMaxwebsite for more information: http://www.epilepsyfoundation.org/answerplace/Social/driving/drivingu.cfm   5.  Maintain good sleep hygiene. Avoid alcohol.  6.  Contact your doctor if you have any problems that may be related to the medicine you are taking.  7.  Call 911 and bring the patient back to the ED if:        A.  The seizure lasts longer than 5 minutes.       B.  The patient doesn't awaken shortly after the seizure  C.  The patient has new problems such as difficulty seeing, speaking or moving  D.  The patient was injured during the seizure  E.  The patient has a temperature over 102 F (39C)  F.  The patient vomited and now is having trouble breathing

## 2022-04-25 NOTE — Progress Notes (Signed)
NEUROLOGY FOLLOW UP OFFICE NOTE  Mark English AL:[id number] 11/28/1950  HISTORY OF PRESENT ILLNESS: I had the pleasure of seeing Mark English in follow-up in the neurology clinic on 04/25/2022.  The patient was last seen almost a year ago for left temporal lobe epilepsy. He is alone in the office today. Records and images were personally reviewed where available. Repeat MRI brain with and without contrast done 01/2021 no acute changes, no substantial change in subtle patchy enhancement in left hippocampus with asymmetric left hippocampal volume loss, similar differentials including autoimmune encephalitis, sequela of recent seizure activity, or low grade glioma. Enhancement is atypical for mesial temporal sclerosis. We had discussed repeating brain MRI in 01/2022 but this has not been done yet.   Seizures consisted of stereotyped episodes where he would start feeling a sense of dread, voice changes, then almost immediately right arm becomes hard to control, with dystonic posturing. His EEG showed an electrographic seizure arising from the left temporal region, no clinical changes seen. He had side effects on Levetiracetam and was switched to oxcarbazepine, dose increased to 969m BID in 04/2021. He denies any side effects. He reports that the right hand has not been bothering him at all. [place] which he feels also helped. The feeling of dread/voice cracking still happens around once a week, sometimes associated with a movie he is watching, other times with no clear trigger. He denies any staring/unresponsive episodes, gaps in time. He has dizziness a couple of times a month with no associated confusion. No headaches. He usually gets 5-6 hours of sleep. When asked about mood, he reports his wife has noticed he is a little more serious, not joking around like he used to.    History on Initial Assessment 01/27/2020: This is a 72year old  left-handed man with a history of hypertension, diabetes, AV block, cognitive impairment, chronic PTSD, presenting for evaluation of focal seizures. He states symptoms have been going on for years, but became more noticeable in the past year or so. He describes episodes starting with a sudden "real serious sense of dread." His voice would change, it sounds like he is almost ready to cry, like he is straining to speak, then almost immediately his right arm becomes hard to control. If he is holding a drink, it would spill. His hand is like a claw, then goes in a fist. He can voluntarily flex at the elbow and hold it his arm to his shoulder until the episode subsides in a couple of minutes. There is no associated loss of awareness, his wife denies any staring/unresponsive episodes. No focal jerking, numbness/tingling/post-event weakness. They can occur multiple times a day, longest seizure-free interval of 1-2 days. He has noticed that when he is playing computer games, he can have 3-4 episodes within 10 minutes. His wife has not noticed any nocturnal episodes. Face and leg are not affected. He denies any gaps in time, olfactory/gustatory hallucinations, myoclonic jerks. He has rare episodes of deja vu. He denies any headaches, dizziness, diplopia, dysarthria/dysphagia, neck/back pain, bowel/bladder dysfunction. He had an EEG done 01/27/20 which captured a focal electrographic seizure arising from the left temporal region. He started Levetiracetam 5072mBID 2 weeks ago and feels there "may be" a little reduction with the seizures, they are not as often as they used to be. 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 His wife started noticing changes 3-4 years ago. He forgets conversations. His wife reports he puts dishes in the wrong place despite repeated instructions. He forgets things they did or places they went. He denies getting lost  driving. He occasionally forgets his medications. His wife manages finances. His parents have memory problems. He denies any alcohol use. 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 His wife is concerned he was exposed to an extremely small amount of Sarin gas in the [place] war in 1991. 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 His wife reports he is extremely restless when he sleeps, talks in his sleep. He has a diagnosis of PTSD. He is on Donepezil and Memantine.   Epilepsy Risk Factors:  He had a normal birth and early development.  There is no history of febrile convulsions, CNS infections such as meningitis/encephalitis, significant traumatic brain injury, neurosurgical procedures, or family history of seizures.  Prior ASMs: Levetiracetam (mood swings)  Diagnostic Data: EEG done 01/27/20 which captured a focal electrographic seizure arising from the left temporal region MRI brain with and without contrast done 03/2020 and 01/2021 showed subtle increased T2 signal within the left amygdala with patchy contrast enhancement. No mass effect. Findings may be related to autoimmune encephalitis, infectious encephalitis, recent seizure activity and low-grade glioma. Associated volume loss of the ipsilateral hippocampus favors longstanding process but enhancement is atypical for mesial temporal sclerosis   PAST MEDICAL HISTORY: Past Medical History:  Diagnosis Date   Atrioventricular block, Mobitz type 2    Diabetes mellitus without complication (HCC)    Disease of intestine    Hypertension    Irregular heart beat    Type 2 diabetes mellitus with hyperglycemia (HCC)    Unspecified urinary incontinence     MEDICATIONS: Current Outpatient Medications on File Prior to Visit  Medication Sig Dispense Refill   insulin lispro (HUMALOG) 100 UNIT/ML injection Inject into the skin in the morning and at  bedtime.     Insulin Lispro Prot & Lispro (HUMALOG 75/25 MIX) (75-25) 100 UNIT/ML Kwikpen Inject into the skin. 20 units in the morning and 30 unit at night     ketorolac (ACULAR) 0.5 % ophthalmic solution      oxcarbazepine (TRILEPTAL) 600 MG tablet Take 1 and 1/2 tablet twice a day 270 tablet 3   ofloxacin (OCUFLOX) 0.3 % ophthalmic solution  (Patient not taking: Reported on 05/24/2021)     prednisoLONE acetate (PRED FORTE) 1 % ophthalmic suspension  (Patient not taking: Reported on 05/24/2021)     No current facility-administered medications on file prior to visit.    ALLERGIES: Allergies  Allergen Reactions   Vicodin [Hydrocodone-Acetaminophen] Other (See Comments)    Blood pressure dropped    FAMILY HISTORY: Family History  Problem Relation Age of Onset   Diabetes Mother    Heart disease Father     SOCIAL HISTORY: Social History   Socioeconomic History   Marital status: Married    Spouse name: Not on file   Number of children: 5   Years of education: Not on file   Highest education level: Not on file  Occupational History   Occupation: retired Land  Tobacco Use   Smoking status: Never   Smokeless tobacco: Never  Vaping Use   Vaping Use: Never used  Substance and Sexual Activity  Alcohol use: Not Currently   Drug use: No   Sexual activity: Not on file  Other Topics Concern   Not on file  Social History Narrative   Left handed    Lives with wife   Social Determinants of Health   Financial Resource Strain: Not on file  Food Insecurity: Not on file  Transportation Needs: Not on file  Physical Activity: Not on file  Stress: Not on file  Social Connections: Not on file  Intimate Partner Violence: Not on file     PHYSICAL EXAM: Vitals:   04/25/22 0818  BP: (!) 143/73  Pulse: 76  SpO2: 99%   General: No acute distress Head:  Normocephalic/atraumatic Skin/Extremities: No rash, no edema Neurological Exam: alert and awake. No aphasia or dysarthria.  Fund of knowledge is appropriate.  Attention and concentration are normal.   Cranial nerves: Pupils equal, round. Extraocular movements intact with no nystagmus. Visual fields full.  No facial asymmetry.  Motor: Bulk and tone normal, muscle strength 5/5 throughout with no pronator drift.   Finger to nose testing intact.  Gait narrow-based and steady, no ataxia. Reports bilateral hip pain.    IMPRESSION: This is a 72 yo LH man with a history of hypertension, diabetes, AV block, cognitive impairment, chronic PTSD, with left temporal lobe epilepsy. MRI brain in 03/2020 and 01/2021 again showed increased signal in the left amygdala with patchy contrast enhancement, likely related to recent seizure activity. Differentials include autoimmune encephalitis, low grade glioma, we had discussed repeating imaging for 01/2022 however he reports that he will be transferring all his care to the [facility] and would like to have his brain MRI done through the [place]. Continue oxcarbazepine 988m BID (6046m1.5 tabs BID). He is aware of [place] driving laws to stop driving after a seizure until 6 months seizure-free. Follow-up as needed, call for any changes.    Thank you for allowing me to participate in his care.  Please do not hesitate to call for any questions or concerns.    KaEllouise NewerM.D.   CC: Dr. RiDarron Doom
